# Patient Record
Sex: Male | Born: 2000 | Race: Black or African American | Hispanic: No | Smoking: Never smoker
Health system: Southern US, Community
[De-identification: ages and names within clinical notes are randomized; demographics above are authoritative.]

## PROBLEM LIST (undated history)

## (undated) DIAGNOSIS — F32A Depression, unspecified: Secondary | ICD-10-CM

## (undated) DIAGNOSIS — F419 Anxiety disorder, unspecified: Secondary | ICD-10-CM

---
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
## 2023-04-02 ENCOUNTER — Ambulatory Visit (HOSPITAL_COMMUNITY)
Admission: EM | Admit: 2023-04-02 | Discharge: 2023-04-03 | Disposition: A | Discharge status: Other Acute Inpatient Hospital | Payer: Medicaid Other | Attending: Psychiatry | Admitting: Psychiatry

## 2023-04-02 ENCOUNTER — Ambulatory Visit: Payer: Self-pay

## 2023-04-02 DIAGNOSIS — F339 Major depressive disorder, recurrent, unspecified: Secondary | ICD-10-CM | POA: Diagnosis not present

## 2023-04-02 DIAGNOSIS — R451 Restlessness and agitation: Secondary | ICD-10-CM | POA: Insufficient documentation

## 2023-04-02 DIAGNOSIS — F1210 Cannabis abuse, uncomplicated: Secondary | ICD-10-CM | POA: Insufficient documentation

## 2023-04-02 DIAGNOSIS — R45851 Suicidal ideations: Secondary | ICD-10-CM | POA: Insufficient documentation

## 2023-04-02 LAB — CBC WITH DIFFERENTIAL/PLATELET
Abs Immature Granulocytes: 0.01 10*3/uL (ref 0.00–0.07)
Basophils Absolute: 0 10*3/uL (ref 0.0–0.1)
Basophils Relative: 1 %
Eosinophils Absolute: 0.1 10*3/uL (ref 0.0–0.5)
Eosinophils Relative: 2 %
HCT: 45.6 % (ref 39.0–52.0)
Hemoglobin: 14.8 g/dL (ref 13.0–17.0)
Immature Granulocytes: 0 %
Lymphocytes Relative: 44 %
Lymphs Abs: 2.7 10*3/uL (ref 0.7–4.0)
MCH: 28.4 pg (ref 26.0–34.0)
MCHC: 32.5 g/dL (ref 30.0–36.0)
MCV: 87.5 fL (ref 80.0–100.0)
Monocytes Absolute: 0.5 10*3/uL (ref 0.1–1.0)
Monocytes Relative: 8 %
Neutro Abs: 2.8 10*3/uL (ref 1.7–7.7)
Neutrophils Relative %: 45 %
Platelets: 212 10*3/uL (ref 150–400)
RBC: 5.21 MIL/uL (ref 4.22–5.81)
RDW: 12.1 % (ref 11.5–15.5)
WBC: 6.1 10*3/uL (ref 4.0–10.5)
nRBC: 0 % (ref 0.0–0.2)

## 2023-04-02 LAB — COMPREHENSIVE METABOLIC PANEL
ALT: 16 U/L (ref 0–44)
AST: 23 U/L (ref 15–41)
Albumin: 5 g/dL (ref 3.5–5.0)
Alkaline Phosphatase: 53 U/L (ref 38–126)
Anion gap: 11 (ref 5–15)
BUN: 12 mg/dL (ref 6–20)
CO2: 24 mmol/L (ref 22–32)
Calcium: 10.2 mg/dL (ref 8.9–10.3)
Chloride: 106 mmol/L (ref 98–111)
Creatinine, Ser: 0.97 mg/dL (ref 0.61–1.24)
GFR, Estimated: >60 mL/min (ref >60)
Glucose, Bld: 85 mg/dL (ref 70–99)
Potassium: 3.8 mmol/L (ref 3.5–5.1)
Sodium: 141 mmol/L (ref 135–145)
Total Bilirubin: 0.6 mg/dL (ref <1.2)
Total Protein: 8.2 g/dL — ABNORMAL HIGH (ref 6.5–8.1)

## 2023-04-02 LAB — POCT URINE DRUG SCREEN - MANUAL ENTRY (I-SCREEN)
POC Amphetamine UR: NOT DETECTED
POC Buprenorphine (BUP): NOT DETECTED
POC Cocaine UR: NOT DETECTED
POC Marijuana UR: POSITIVE — AB
POC Methadone UR: NOT DETECTED
POC Methamphetamine UR: NOT DETECTED
POC Morphine: NOT DETECTED
POC Oxazepam (BZO): NOT DETECTED
POC Oxycodone UR: NOT DETECTED
POC Secobarbital (BAR): NOT DETECTED

## 2023-04-02 LAB — TSH: TSH: 1.886 u[IU]/mL (ref 0.350–4.500)

## 2023-04-02 LAB — ETHANOL: Alcohol, Ethyl (B): <10 mg/dL (ref <10)

## 2023-04-02 MED ORDER — TRAZODONE HCL 50 MG PO TABS: 50.0000 mg | ORAL_TABLET | Freq: Every evening | ORAL | Status: DC | PRN

## 2023-04-02 MED ORDER — LORAZEPAM 1 MG PO TABS: 1.0000 mg | ORAL_TABLET | ORAL | Status: DC | PRN

## 2023-04-02 MED ORDER — ZIPRASIDONE MESYLATE 20 MG IM SOLR: 20.0000 mg | INTRAMUSCULAR | Status: DC | PRN

## 2023-04-02 MED ORDER — MAGNESIUM HYDROXIDE 400 MG/5ML PO SUSP: 30.0000 mL | Freq: Every day | ORAL | Status: DC | PRN

## 2023-04-02 MED ORDER — ACETAMINOPHEN 325 MG PO TABS: 650.0000 mg | ORAL_TABLET | Freq: Four times a day (QID) | ORAL | Status: DC | PRN

## 2023-04-02 MED ORDER — OLANZAPINE 5 MG PO TBDP: 5.0000 mg | ORAL_TABLET | Freq: Three times a day (TID) | ORAL | Status: DC | PRN

## 2023-04-02 MED ORDER — HYDROXYZINE HCL 25 MG PO TABS
50.0000 mg | ORAL_TABLET | ORAL | Status: AC
  Administered 2023-04-02: 50 mg via ORAL
  Filled 2023-04-02: qty 2

## 2023-04-02 MED ORDER — ALUM & MAG HYDROXIDE-SIMETH 200-200-20 MG/5ML PO SUSP: 30.0000 mL | ORAL | Status: DC | PRN

## 2023-04-02 NOTE — ED Provider Notes (Signed)
[facility] Continuous Assessment Admission H&P  Date: 04/03/23 Patient Name: [facility] MRN: [id number] Chief Complaint: emotion distress,  suicidal thoughts  Diagnoses:  Final diagnoses:  Suicidal ideation  Marijuana abuse  Recurrent major depressive disorder, remission status unspecified (HCC)    HPI: [facility], 22 y/o male with a history of MDD and agitation presented to [facility] voluntarily accompanied by his fiance.  According to the patient he had a meltdown today and he was suicidal when asked what was the plan he stated he was going to take medications that he had which was Wellbutrin and clonidine he was stressed, OD on those medicines.  According to the patient he was prescribed Wellbutrin and clonidine 2 years ago when he was going to [facility] and he had never took all the medicines so he had some he was going to take them when he felt suicidal today. According to patient he is currently not seeing a psychiatrist or therapist.  According to the patient he lives with his fiance currently in school for nursing and he also work at an [employer].  Face-to-face evaluation of patient, patient is alert and oriented x 4, speech is clear, maintaining eye contact.  Patient is pleasant answer all questions appropriately.  According to patient he is not currently at the moment suicidal but he knows that he might be triggered again.  Patient denies HI, AVH or paranoia.  Patient reports he smoked marijuana on a daily basis, patient denies alcohol use or any other illicit drug use at this time.  Writer did discuss with patient that given the fact that he had a plan in place when he was suicidal today it would be best for him and in his interest to stay for overnight observation and be reassessed in the AM. Writer also spoke with Dr. Clovis Riley presented the case to him and he also is in agreement for patient to be observed overnight and reassess in the a.m. At this time patient does not seem  to be influenced by internal or external stimuli.  However given patient reason for coming in it is advised that patient be admitted.  Recommend inpatient observation.  Total Time spent with patient: 20 minutes  Musculoskeletal  Strength & Muscle Tone: within normal limits Gait & Station: normal Patient leans: N/A  Psychiatric Specialty Exam  Presentation General Appearance:  Appropriate for Environment  Eye Contact: Good  Speech: Clear and Coherent  Speech Volume: Normal  Handedness: Right   Mood and Affect  Mood: Anxious  Affect: Congruent   Thought Process  Thought Processes: Coherent  Descriptions of Associations:Circumstantial  Orientation:Full (Time, Place and Person)  Thought Content:Logical  Diagnosis of Schizophrenia or Schizoaffective disorder in past: No   Hallucinations:Hallucinations: None  Ideas of Reference:None  Suicidal Thoughts:Suicidal Thoughts: Yes, Passive SI Passive Intent and/or Plan: With Intent; With Plan  Homicidal Thoughts:Homicidal Thoughts: No   Sensorium  Memory: Immediate Fair  Judgment: Fair  Insight: Fair   Executive Functions  Concentration: Good  Attention Span: Good  Recall: Good  Fund of Knowledge: Good  Language: Good   Psychomotor Activity  Psychomotor Activity: Psychomotor Activity: Normal   Assets  Assets: Desire for Improvement; Resilience   Sleep  Sleep: Sleep: Fair Number of Hours of Sleep: 8   Nutritional Assessment (For OBS and FBC admissions only) Has the patient had a weight loss or gain of 10 pounds or more in the last 3 months?: No Has the patient had a decrease in food intake/or  appetite?: No Does the patient have dental problems?: No Does the patient have eating habits or behaviors that may be indicators of an eating disorder including binging or inducing vomiting?: No Has the patient recently lost weight without trying?: 0 Has the patient been eating poorly  because of a decreased appetite?: 0 Malnutrition Screening Tool Score: 0    Physical Exam HENT:     Head: Normocephalic.     Nose: Nose normal.  Eyes:     Pupils: Pupils are equal, round, and reactive to light.  Cardiovascular:     Rate and Rhythm: Normal rate.  Pulmonary:     Effort: Pulmonary effort is normal.  Musculoskeletal:        General: Normal range of motion.     Cervical back: Normal range of motion.  Neurological:     General: No focal deficit present.     Mental Status: He is alert.  Psychiatric:        Mood and Affect: Mood normal.        Behavior: Behavior normal.        Thought Content: Thought content normal.        Judgment: Judgment normal.    Review of Systems  Constitutional: Negative.   HENT: Negative.    Eyes: Negative.   Respiratory: Negative.    Cardiovascular: Negative.   Gastrointestinal: Negative.   Genitourinary: Negative.   Musculoskeletal: Negative.   Skin: Negative.   Neurological: Negative.   Psychiatric/Behavioral:  Positive for depression, substance abuse and suicidal ideas. The patient is nervous/anxious.     Blood pressure (!) 137/98, pulse (!) 53, temperature 98.3 F (36.8 C), temperature source Oral, resp. rate 18, SpO2 100%. There is no height or weight on file to calculate BMI.  Past Psychiatric History: MDD and agitation    Is the patient at risk to self? Yes  Has the patient been a risk to self in the past 6 months? Yes .    Has the patient been a risk to self within the distant past? No   Is the patient a risk to others? No   Has the patient been a risk to others in the past 6 months? No   Has the patient been a risk to others within the distant past? No   Past Medical History: see chart  Family History: bipolar (dad),   Social History: marijuana   Last Labs:  Admission on 04/02/2023  Component Date Value Ref Range Status   WBC 04/02/2023 6.1  4.0 - 10.5 K/uL Final   RBC 04/02/2023 5.21  4.22 - 5.81 MIL/uL  Final   Hemoglobin 04/02/2023 14.8  13.0 - 17.0 g/dL Final   HCT 16/01/9603 45.6  39.0 - 52.0 % Final   MCV 04/02/2023 87.5  80.0 - 100.0 fL Final   MCH 04/02/2023 28.4  26.0 - 34.0 pg Final   MCHC 04/02/2023 32.5  30.0 - 36.0 g/dL Final   RDW 54/12/8117 12.1  11.5 - 15.5 % Final   Platelets 04/02/2023 212  150 - 400 K/uL Final   nRBC 04/02/2023 0.0  0.0 - 0.2 % Final   Neutrophils Relative % 04/02/2023 45  % Final   Neutro Abs 04/02/2023 2.8  1.7 - 7.7 K/uL Final   Lymphocytes Relative 04/02/2023 44  % Final   Lymphs Abs 04/02/2023 2.7  0.7 - 4.0 K/uL Final   Monocytes Relative 04/02/2023 8  % Final   Monocytes Absolute 04/02/2023 0.5  0.1 - 1.0 K/uL Final  Eosinophils Relative 04/02/2023 2  % Final   Eosinophils Absolute 04/02/2023 0.1  0.0 - 0.5 K/uL Final   Basophils Relative 04/02/2023 1  % Final   Basophils Absolute 04/02/2023 0.0  0.0 - 0.1 K/uL Final   Immature Granulocytes 04/02/2023 0  % Final   Abs Immature Granulocytes 04/02/2023 0.01  0.00 - 0.07 K/uL Final   Performed at [facility], [address], [place], [place] [address]   Sodium 04/02/2023 141  135 - 145 mmol/L Final   Potassium 04/02/2023 3.8  3.5 - 5.1 mmol/L Final   Chloride 04/02/2023 106  98 - 111 mmol/L Final   CO2 04/02/2023 24  22 - 32 mmol/L Final   Glucose, Bld 04/02/2023 85  70 - 99 mg/dL Final   Glucose reference range applies only to samples taken after fasting for at least 8 hours.   BUN 04/02/2023 12  6 - 20 mg/dL Final   Creatinine, Ser 04/02/2023 0.97  0.61 - 1.24 mg/dL Final   Calcium 96/29/5284 10.2  8.9 - 10.3 mg/dL Final   Total Protein 13/24/4010 8.2 (H)  6.5 - 8.1 g/dL Final   Albumin 27/25/3664 5.0  3.5 - 5.0 g/dL Final   AST 40/34/7425 23  15 - 41 U/L Final   ALT 04/02/2023 16  0 - 44 U/L Final   Alkaline Phosphatase 04/02/2023 53  38 - 126 U/L Final   Total Bilirubin 04/02/2023 0.6  <1.2 mg/dL Final   GFR, Estimated 04/02/2023 >60  >60 mL/min Final   Comment:  (NOTE) Calculated using the CKD-EPI Creatinine Equation (2021)    Anion gap 04/02/2023 11  5 - 15 Final   Performed at [facility], [address] [place], [place] [address]   Alcohol, Ethyl (B) 04/02/2023 <10  <10 mg/dL Final   Comment: (NOTE) Lowest detectable limit for serum alcohol is 10 mg/dL.  For medical purposes only. Performed at [facility], [address] [place], [place] [address]    TSH 04/02/2023 1.886  0.350 - 4.500 uIU/mL Final   Comment: Performed by a 3rd Generation assay with a functional sensitivity of <=0.01 uIU/mL. Performed at [facility], [address], [place], [place] [address]    POC Amphetamine UR 04/02/2023 None Detected  NONE DETECTED (Cut Off Level 1000 ng/mL) Final   POC Secobarbital (BAR) 04/02/2023 None Detected  NONE DETECTED (Cut Off Level 300 ng/mL) Final   POC Buprenorphine (BUP) 04/02/2023 None Detected  NONE DETECTED (Cut Off Level 10 ng/mL) Final   POC Oxazepam (BZO) 04/02/2023 None Detected  NONE DETECTED (Cut Off Level 300 ng/mL) Final   POC Cocaine UR 04/02/2023 None Detected  NONE DETECTED (Cut Off Level 300 ng/mL) Final   POC Methamphetamine UR 04/02/2023 None Detected  NONE DETECTED (Cut Off Level 1000 ng/mL) Final   POC Morphine 04/02/2023 None Detected  NONE DETECTED (Cut Off Level 300 ng/mL) Final   POC Methadone UR 04/02/2023 None Detected  NONE DETECTED (Cut Off Level 300 ng/mL) Final   POC Oxycodone UR 04/02/2023 None Detected  NONE DETECTED (Cut Off Level 100 ng/mL) Final   POC Marijuana UR 04/02/2023 Positive (A)  NONE DETECTED (Cut Off Level 50 ng/mL) Final    Allergies: Shellfish allergy  Medications:  Facility Ordered Medications  Medication   acetaminophen (TYLENOL) tablet 650 mg   alum & mag hydroxide-simeth (MAALOX/MYLANTA) 200-200-20 MG/5ML suspension 30 mL   magnesium hydroxide (MILK OF MAGNESIA) suspension 30 mL   OLANZapine  zydis (ZYPREXA) disintegrating tablet 5 mg   And   LORazepam  (ATIVAN) tablet 1 mg   And   ziprasidone (GEODON) injection 20 mg   [COMPLETED] hydrOXYzine (ATARAX) tablet 50 mg   traZODone (DESYREL) tablet 50 mg      Medical Decision Making  Inpatient observation  Lab Orders         SARS Coronavirus 2 by RT PCR (hospital order, performed in [facility] hospital lab) *cepheid single result test* Anterior Nasal Swab         CBC with Differential/Platelet         Comprehensive metabolic panel         Ethanol         TSH         POCT Urine Drug Screen - (I-Screen)       Meds ordered this encounter  Medications   acetaminophen (TYLENOL) tablet 650 mg   alum & mag hydroxide-simeth (MAALOX/MYLANTA) 200-200-20 MG/5ML suspension 30 mL   magnesium hydroxide (MILK OF MAGNESIA) suspension 30 mL   AND Linked Order Group    OLANZapine zydis (ZYPREXA) disintegrating tablet 5 mg    LORazepam (ATIVAN) tablet 1 mg    ziprasidone (GEODON) injection 20 mg   hydrOXYzine (ATARAX) tablet 50 mg   traZODone (DESYREL) tablet 50 mg    Recommendations  Based on my evaluation the patient does not appear to have an emergency medical condition.  [name], NP 04/03/23  5:31 AM

## 2023-04-02 NOTE — Telephone Encounter (Signed)
Chief Complaint: Suicidal ideation Symptoms: Plan and access to pills for plan Frequency: Ongoing Pertinent Negatives: Patient denies Current thoughts of suicide Disposition: [] ED /[x] Urgent Care (no appt availability in office) / [] Appointment(In office/virtual)/ []  [facility]/ [] Home Care/ [] Refused Recommended Disposition /[] [facility]/ []  Follow-up with PCP Additional Notes: Pt called with suicidal ideation. He plans to take a bunch of pills and has those pills available. Pt has struggled with mental health since the 7th grade. He used to have medication to help, but stopped taking the medication. Pt restarted meds in November 23? but meds are old. Pt is in college with a full load, and had a disagreement today with fiancee. Argument has been resolved.  Pt given phone number for BH hot line, and [facility]. Pt states he will go to [facility] now for care. Pt will call back if needed.    Reason for Disposition  [1] Patient is not threatening suicide now BUT [2] has a suicide PLAN (e.g., overdose, gunshot) and ACCESS (e.g., collecting pills, gun in house)  Answer Assessment - Initial Assessment Questions 1. MAIN CONCERN: "What happened that made you call today?"     Suicidal thoughts 2. RISK OF HARM - SUICIDAL IDEATION:  "Do you ever have thoughts of hurting or killing yourself?"  (e.g., yes, no, no but preoccupation with thoughts about death)   - WISH TO BE DEAD:  "Have you wished you were dead or wished you could go to sleep and not wake up?"   - INTENT:  "Have you had any thoughts of hurting or killing yourself?" (e.g., yes, no, N/A) If Yes, ask: "Are you having these thoughts about killing yourself right NOW?"   - PLAN: "Have you thought about how you might do this?" "Do you have a specific plan for how you would do this?" (e.g., gun, knife, overdose, no plan, N/A)   - ACCESS: If yes to PLAN, "Do you have access to pills?" (e.g., pills, gun in house, knife in kitchen)      Wishing he was dead - a bunch of pills. 3. RISK OF HARM - SUICIDE ATTEMPT: "Have you tried to harm yourself recently?" If Yes, ask: When was this?"  "What type of harm was tried?"     No yet 4. RISK OF HARM - SUICIDAL BEHAVIOR: "Have you ever done anything, started to do anything, or prepared to do anything to end your life?" (e.g., collected pills, bought a gun, wrote a suicide note, cut yourself, started but changed your mind)     yes 5. EVENTS AND STRESSORS: "Has there been any new stress or recent changes in your life?" (e.g., death of loved one, homelessness, negative event, relationship breakup, work)     Worse - argument with fiance 6. FUNCTIONAL IMPAIRMENT: "How have things been going for you overall? Have you had more difficulty than usual doing your normal daily activities?"  (e.g., better, same, worse; self-care, school, work, interactions)     stress 7. SUPPORT: "Who is with you now?" "Who do you live with?" "Do you have family or friends who you can talk to?"      fiance 8. THERAPIST: "Do you have a counselor or therapist? What is their name?"     no 9. ALCOHOL USE OR SUBSTANCE USE (DRUG USE): "Do you drink alcohol or use any illegal drugs (or prescription drugs in ways other than prescribed)?"     weed 10. OTHER: "Do you have any other physical symptoms right now?" (e.g.,  fever)       No - sweaty palms  Protocols used: Suicide Concerns-A-AH

## 2023-04-02 NOTE — Progress Notes (Signed)
   04/02/23 1750  [facility] Triage Screening (Walk-ins at [facility] only)  How Did You Hear About [place]? Self  What Is the Reason for Your Visit/Call Today? Patient is a 22 y.o. male with a hx of depression, untreated, who presents voluntarily accompanied by fiance for assessment.  Patient reports he has been experiencing depression and "other mental health issues" since 7th grade.  Patient is a Holiday representative at [employer], Chiropractor.  He reports having an agrument with fiance this morning, stating he needed time to himself.  During this moment, patient began to contemplate overdosing on wellbutrin and guanfacine (old Rx meds from 2 yrs ago).  He was getting ready to take the pills when his girlfriend happened to walk in.  Patient states he had a bit of a melt down and fiance was able to help him calm.  She asked if he thought he needed to be evaluated and he agreed to come.  Patient denies current suicidal intent, however believes he will continue to "have the thoughts."  Patient states he would prefer to follow up with outpatient treatment, stating he has a rigorous school schedule.  He is also uncomfortable with staying away from home.  Patient states he began taking the Wellbutrin again "therapeutically" on 11/23, hoping for some relief with depressive symptoms.  Patient denies HI and AVH.  He does report using THC daily, stating "it does help some."  How Long Has This Been Causing You Problems? > than 6 months  Have You Recently Had Any Thoughts About Hurting Yourself? Yes  How long ago did you have thoughts about hurting yourself? Today  Are You Planning to Commit Suicide/Harm Yourself At This time? No  Have you Recently Had Thoughts About Hurting Someone Karolee Ohs? No  Are You Planning To Harm Someone At This Time? No  Physical Abuse Denies  Verbal Abuse Denies  Sexual Abuse Denies  Exploitation of patient/patient's resources Denies  Self-Neglect Denies  Possible abuse reported to:  (N/A)  Are you currently  experiencing any auditory, visual or other hallucinations? No  Have You Used Any Alcohol or Drugs in the Past 24 Hours? Yes  How long ago did you use Drugs or Alcohol? Today  What Did You Use and How Much? THC - amt unknown  Do you have any current medical co-morbidities that require immediate attention? No  Clinician description of patient physical appearance/behavior: Patient is calm, cooperative, pleasant AAOx5  What Do You Feel Would Help You the Most Today? Treatment for Depression or other mood problem  If access to [facility] was not available, would you have sought care in the Emergency Department? No  Determination of Need Urgent (48 hours)  Options For Referral Medication Management;Outpatient Therapy;Intensive Outpatient Therapy;Inpatient Hospitalization;BH Urgent Care  Determination of Need filed? Yes
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
## 2023-04-03 ENCOUNTER — Encounter (HOSPITAL_COMMUNITY): Payer: Self-pay | Admitting: Student in an Organized Health Care Education/Training Program

## 2023-04-03 ENCOUNTER — Other Ambulatory Visit: Payer: Self-pay

## 2023-04-03 ENCOUNTER — Inpatient Hospital Stay (HOSPITAL_COMMUNITY)
Admission: AD | Admit: 2023-04-03 | Discharge: 2023-04-07 | DRG: 885 | Disposition: A | Discharge status: Home / Self Care | Payer: Medicaid Other | Source: Intra-hospital | Attending: Psychiatry | Admitting: Psychiatry

## 2023-04-03 ENCOUNTER — Inpatient Hospital Stay (HOSPITAL_COMMUNITY): Admit: 2023-04-03 | Payer: Medicaid Other | Admitting: Psychiatry

## 2023-04-03 ENCOUNTER — Encounter (HOSPITAL_COMMUNITY): Payer: Self-pay

## 2023-04-03 DIAGNOSIS — Z733 Stress, not elsewhere classified: Secondary | ICD-10-CM | POA: Diagnosis not present

## 2023-04-03 DIAGNOSIS — Z5941 Food insecurity: Secondary | ICD-10-CM

## 2023-04-03 DIAGNOSIS — Z789 Other specified health status: Secondary | ICD-10-CM | POA: Diagnosis not present

## 2023-04-03 DIAGNOSIS — Z833 Family history of diabetes mellitus: Secondary | ICD-10-CM

## 2023-04-03 DIAGNOSIS — Z5986 Financial insecurity: Secondary | ICD-10-CM | POA: Diagnosis not present

## 2023-04-03 DIAGNOSIS — F1210 Cannabis abuse, uncomplicated: Secondary | ICD-10-CM | POA: Diagnosis present

## 2023-04-03 DIAGNOSIS — Z818 Family history of other mental and behavioral disorders: Secondary | ICD-10-CM | POA: Diagnosis not present

## 2023-04-03 DIAGNOSIS — R45851 Suicidal ideations: Secondary | ICD-10-CM | POA: Diagnosis present

## 2023-04-03 DIAGNOSIS — F411 Generalized anxiety disorder: Secondary | ICD-10-CM | POA: Insufficient documentation

## 2023-04-03 DIAGNOSIS — Z87892 Personal history of anaphylaxis: Secondary | ICD-10-CM

## 2023-04-03 DIAGNOSIS — Z91013 Allergy to seafood: Secondary | ICD-10-CM | POA: Diagnosis not present

## 2023-04-03 DIAGNOSIS — F332 Major depressive disorder, recurrent severe without psychotic features: Secondary | ICD-10-CM | POA: Diagnosis present

## 2023-04-03 DIAGNOSIS — Z8249 Family history of ischemic heart disease and other diseases of the circulatory system: Secondary | ICD-10-CM | POA: Diagnosis not present

## 2023-04-03 DIAGNOSIS — R197 Diarrhea, unspecified: Secondary | ICD-10-CM | POA: Diagnosis not present

## 2023-04-03 DIAGNOSIS — Z630 Problems in relationship with spouse or partner: Secondary | ICD-10-CM

## 2023-04-03 DIAGNOSIS — Z91199 Patient's noncompliance with other medical treatment and regimen due to unspecified reason: Secondary | ICD-10-CM | POA: Diagnosis not present

## 2023-04-03 MED ORDER — LORAZEPAM 1 MG PO TABS: 1.0000 mg | ORAL_TABLET | Freq: Four times a day (QID) | ORAL | Status: DC | PRN

## 2023-04-03 MED ORDER — ENSURE ENLIVE PO LIQD
237.0000 mL | Freq: Two times a day (BID) | ORAL | Status: DC
  Administered 2023-04-03 – 2023-04-04 (×3): 237 mL via ORAL
  Filled 2023-04-03 (×10): qty 237

## 2023-04-03 MED ORDER — DIPHENHYDRAMINE HCL 50 MG/ML IJ SOLN: 50.0000 mg | Freq: Four times a day (QID) | INTRAMUSCULAR | Status: DC | PRN

## 2023-04-03 MED ORDER — LORAZEPAM 2 MG/ML IJ SOLN
1.0000 mg | Freq: Four times a day (QID) | INTRAMUSCULAR | Status: DC | PRN
Start: 2023-04-03 — End: 2023-04-07

## 2023-04-03 MED ORDER — MAGNESIUM HYDROXIDE 400 MG/5ML PO SUSP: 30.0000 mL | Freq: Every day | ORAL | Status: DC | PRN

## 2023-04-03 MED ORDER — HALOPERIDOL LACTATE 5 MG/ML IJ SOLN: 5.0000 mg | Freq: Four times a day (QID) | INTRAMUSCULAR | Status: DC | PRN

## 2023-04-03 MED ORDER — ALUM & MAG HYDROXIDE-SIMETH 200-200-20 MG/5ML PO SUSP
30.0000 mL | ORAL | Status: DC | PRN
  Administered 2023-04-05: 30 mL via ORAL
  Filled 2023-04-03 (×2): qty 30

## 2023-04-03 MED ORDER — HALOPERIDOL 5 MG PO TABS: 5.0000 mg | ORAL_TABLET | Freq: Four times a day (QID) | ORAL | Status: DC | PRN

## 2023-04-03 MED ORDER — HYDROXYZINE HCL 25 MG PO TABS
25.0000 mg | ORAL_TABLET | Freq: Three times a day (TID) | ORAL | Status: DC | PRN
  Administered 2023-04-03 – 2023-04-06 (×3): 25 mg via ORAL
  Filled 2023-04-03 (×3): qty 1

## 2023-04-03 MED ORDER — TRAZODONE HCL 50 MG PO TABS
50.0000 mg | ORAL_TABLET | Freq: Every evening | ORAL | Status: DC | PRN
  Administered 2023-04-03 – 2023-04-06 (×4): 50 mg via ORAL
  Filled 2023-04-03 (×5): qty 1

## 2023-04-03 MED ORDER — ACETAMINOPHEN 325 MG PO TABS: 650.0000 mg | ORAL_TABLET | Freq: Four times a day (QID) | ORAL | Status: DC | PRN

## 2023-04-03 MED ORDER — DIPHENHYDRAMINE HCL 25 MG PO CAPS: 50.0000 mg | ORAL_CAPSULE | Freq: Four times a day (QID) | ORAL | Status: DC | PRN

## 2023-04-03 MED ORDER — HYDROXYZINE HCL 25 MG PO TABS
25.0000 mg | ORAL_TABLET | Freq: Three times a day (TID) | ORAL | Status: DC | PRN
  Administered 2023-04-03: 25 mg via ORAL
  Filled 2023-04-03: qty 1

## 2023-04-03 NOTE — ED Notes (Signed)
Pt sleeping in no acute distress. RR even and unlabored. Environment secured. Will continue to monitor for safety. 

## 2023-04-03 NOTE — Plan of Care (Signed)
  Problem: Education: Goal: Emotional status will improve Outcome: Progressing Goal: Mental status will improve Outcome: Progressing   Problem: Activity: Goal: Interest or engagement in activities will improve Outcome: Progressing   Problem: Coping: Goal: Ability to demonstrate self-control will improve Outcome: Progressing   Problem: Safety: Goal: Periods of time without injury will increase Outcome: Progressing

## 2023-04-03 NOTE — ED Notes (Signed)
Pt laying in bed at this hour. No apparent distress. RR even and unlabored. Monitored for safety.

## 2023-04-03 NOTE — ED Provider Notes (Addendum)
FBC/OBS ASAP Discharge Summary  Date and Time: 04/03/2023 11:25 AM  Name: Blake Ellis  MRN:  [id number]   Discharge Diagnoses:  Final diagnoses:  Suicidal ideation  Marijuana abuse  Recurrent major depressive disorder, remission status unspecified [facility])    Stay Summary: HPI on arrival to [facility]:  "Blake Ellis, 22 y/o male with a history of MDD and agitation presented to [facility] voluntarily accompanied by his fiance.  According to the patient he had a meltdown today and he was suicidal when asked what was the plan he stated he was going to take medications that he had which was Wellbutrin and clonidine he was stressed, OD on those medicines.  According to the patient he was prescribed Wellbutrin and clonidine 2 years ago when he was going to [facility] medical and he had never took all the medicines so he had some he was going to take them when he felt suicidal today. According to patient he is currently not seeing a psychiatrist or therapist.  According to the patient he lives with his fiance currently in school for nursing and he also work at an [employer].   Face-to-face evaluation of patient, patient is alert and oriented x 4, speech is clear, maintaining eye contact.  Patient is pleasant answer all questions appropriately.  According to patient he is not currently at the moment suicidal but he knows that he might be triggered again.  Patient denies HI, AVH or paranoia.  Patient reports he smoked marijuana on a daily basis, patient denies alcohol use or any other illicit drug use at this time.  Writer did discuss with patient that given the fact that he had a plan in place when he was suicidal today it would be best for him and in his interest to stay for overnight observation and be reassessed in the AM. Writer also spoke with Dr. Clovis Riley presented the case to him and he also is in agreement for patient to be observed overnight and reassess in the a.m. At this time patient does not seem to be  influenced by internal or external stimuli.  However given patient reason for coming in it is advised that patient be admitted."  On day of discharge, the patient reports continues symptoms of depression and anxiety. He denies any SI, HI, or AVH.   Labs were reviewed with the patient, and abnormal results were discussed with the patient.  On arrival to [facility], the patient was initially diagnosed with recurrent major depressive disorder. He was evaluated on day of discharge and where he again admitted to recent active SI with near attempted OD on old prescribed medications. Upon evaluation on day of discharge he has possible r/o for bipolar II disorder, as he reported hx of periods of expansive energy and mood he cycles in and out of. At most he is reporting a maximum of 48 consecutive hours historically, characterized by symptoms of insomnia, increased goal directed activity, pressured speech, flight of ideas.  On assessment, there is no evidence of manic symptoms, patient's most recent episode appears to be depressive.   Patient is also reporting excessive anxiety and worry occurring most days, with difficulty controlling the worries, restlessness, and thought blocking. He likely meets criteria for GAD.   He also meets criteria  for cannabis use disorder,  as he is reporting relying on daily use with cravings and withdrawal symptoms when he tries to stop.   Ultimately the patient agrees to voluntary admission for psychiatric stabilization.   Patient's psychiatric medications were  adjusted on admission to observation: None  Patient's care was discussed during the interdisciplinary team meeting every day during the hospitalization.  Gradually, patient started adjusting to milieu. The patient was evaluated each day by a clinical provider to ascertain response to treatment. Improvement was noted by the patient's report of decreasing symptoms, improved sleep and appetite, affect, medication tolerance,  behavior, and participation in unit programming.  Patient was asked each day to complete a self inventory noting mood, mental status, pain, new symptoms, anxiety and concerns.    Symptoms were reported as significantly decreased or resolved completely by discharge.    Total Time spent with patient: 1 hour  Past Psychiatric History:  No current psychiatrist or therapist. Reports previously going to [facility]. Denies any prior psychiatric diagnoses Trialed previously on Wellbutrin and clonidine 2 years ago, but discontinued medications shortly after being prescribed this.  No prior psychiatric hospitalizations No prior Suicide attempts  Past Medical History: No significant PMHx. No medical conditions or medications/supplements. No hx of surgeries. Allergy only to shellfish.   Social History:  Lives with fiance of 1.5 years. He is currently a Information systems manager.  Tobacco Cessation:  N/A, patient does not currently use tobacco products  Current Medications:  Current Facility-Administered Medications  Medication Dose Route Frequency Provider Last Rate Last Admin   acetaminophen (TYLENOL) tablet 650 mg  650 mg Oral Q6H PRN [name], NP       alum & mag hydroxide-simeth (MAALOX/MYLANTA) 200-200-20 MG/5ML suspension 30 mL  30 mL Oral Q4H PRN [name], NP       hydrOXYzine (ATARAX) tablet 25 mg  25 mg Oral TID PRN Lorri Frederick, MD   25 mg at 04/03/23 1003   OLANZapine zydis (ZYPREXA) disintegrating tablet 5 mg  5 mg Oral Q8H PRN [name], NP       And   LORazepam (ATIVAN) tablet 1 mg  1 mg Oral PRN [name], NP       And   ziprasidone (GEODON) injection 20 mg  20 mg Intramuscular PRN [name], NP       magnesium hydroxide (MILK OF MAGNESIA) suspension 30 mL  30 mL Oral Daily PRN [name], NP       traZODone (DESYREL) tablet 50 mg  50 mg Oral QHS PRN [name], NP       No current outpatient medications on file.    PTA  Medications:  Facility Ordered Medications  Medication   acetaminophen (TYLENOL) tablet 650 mg   alum & mag hydroxide-simeth (MAALOX/MYLANTA) 200-200-20 MG/5ML suspension 30 mL   magnesium hydroxide (MILK OF MAGNESIA) suspension 30 mL   OLANZapine zydis (ZYPREXA) disintegrating tablet 5 mg   And   LORazepam (ATIVAN) tablet 1 mg   And   ziprasidone (GEODON) injection 20 mg   [COMPLETED] hydrOXYzine (ATARAX) tablet 50 mg   traZODone (DESYREL) tablet 50 mg   hydrOXYzine (ATARAX) tablet 25 mg        No data to display          Flowsheet Row ED from 04/02/2023 in [facility]  C-SSRS RISK CATEGORY High Risk       Musculoskeletal  Strength & Muscle Tone: within normal limits Gait & Station: normal Patient leans: N/A  Psychiatric Specialty Exam  Presentation  General Appearance:  Appropriate for Environment  Eye Contact: Good  Speech: Clear and Coherent; Normal Rate  Speech Volume: Normal  Handedness: -- (not assessed)   Mood and  Affect  Mood: Anxious ("Really worried")  Affect: Congruent; Depressed   Thought Process  Thought Processes: Linear  Descriptions of Associations:Intact  Orientation:-- (grossly intact)  Thought Content:Logical  Diagnosis of Schizophrenia or Schizoaffective disorder in past: No    Hallucinations:Hallucinations: None  Ideas of Reference:None  Suicidal Thoughts:Suicidal Thoughts: No SI Passive Intent and/or Plan: With Intent; With Plan  Homicidal Thoughts:Homicidal Thoughts: No   Sensorium  Memory: Immediate Good; Recent Good; Remote Good  Judgment: Fair  Insight: Fair   Chartered certified accountant: Fair  Attention Span: Fair  Recall: [employer] of Knowledge: Fair  Language: Fair   Psychomotor Activity  Psychomotor Activity: Psychomotor Activity: Normal   Assets  Assets: Desire for Improvement; Resilience; Communication Skills   Sleep   Sleep: Sleep: Poor Number of Hours of Sleep: 8   Nutritional Assessment (For OBS and FBC admissions only) Has the patient had a weight loss or gain of 10 pounds or more in the last 3 months?: No Has the patient had a decrease in food intake/or appetite?: No Does the patient have dental problems?: No Does the patient have eating habits or behaviors that may be indicators of an eating disorder including binging or inducing vomiting?: No Has the patient recently lost weight without trying?: 0 Has the patient been eating poorly because of a decreased appetite?: 0 Malnutrition Screening Tool Score: 0    Physical Exam  Physical Exam Vitals and nursing note reviewed.  Constitutional:      General: He is not in acute distress.    Appearance: He is well-developed.  HENT:     Head: Normocephalic and atraumatic.  Eyes:     Conjunctiva/sclera: Conjunctivae normal.  Cardiovascular:     Rate and Rhythm: Normal rate and regular rhythm.     Heart sounds: No murmur heard. Pulmonary:     Effort: Pulmonary effort is normal. No respiratory distress.     Breath sounds: Normal breath sounds.  Abdominal:     Palpations: Abdomen is soft.     Tenderness: There is no abdominal tenderness.  Musculoskeletal:        General: No swelling.     Cervical back: Neck supple.  Skin:    General: Skin is warm and dry.     Capillary Refill: Capillary refill takes less than 2 seconds.  Neurological:     Mental Status: He is alert.  Psychiatric:        Mood and Affect: Mood normal.    Review of Systems  Respiratory:  Negative for shortness of breath.   Cardiovascular:  Negative for chest pain.  Gastrointestinal:  Negative for abdominal pain and vomiting.  Neurological:  Negative for dizziness and headaches.   Blood pressure 117/88, pulse 87, temperature 98.3 F (36.8 C), temperature source Oral, resp. rate 18, SpO2 99%. There is no height or weight on file to calculate BMI.  Demographic Factors:   Male  Loss Factors: Financial problems/change in socioeconomic status  Historical Factors: Impulsivity  Risk Reduction Factors:   Living with another person, especially a relative and Positive social support  Continued Clinical Symptoms:  Unstable or Poor Therapeutic Relationship  Cognitive Features That Contribute To Risk:  None    Suicide Risk:  Moderate:  Frequent suicidal ideation with limited intensity, and duration, some specificity in terms of plans, no associated intent, good self-control, limited dysphoria/symptomatology, some risk factors present, and identifiable protective factors, including available and accessible social support.  Plan Of Care/Follow-up recommendations:  Activity: as tolerated  Diet: heart healthy  Other: -Follow-up with your outpatient psychiatric provider -instructions on appointment date, time, and address (location) are provided to you in discharge paperwork.  -Take your psychiatric medications as prescribed at discharge - instructions are provided to you in the discharge paperwork  -Follow-up with outpatient primary care doctor and other specialists -for management of preventative medicine and chronic medical disease, including: None  -Testing: Follow-up with outpatient provider for abnormal lab results: None  -Recommend abstinence from alcohol, tobacco, and other illicit drug use at discharge.   -If your psychiatric symptoms recur, worsen, or if you have side effects to your psychiatric medications, call your outpatient psychiatric provider, 911, 988 or go to the nearest emergency department.  -If suicidal thoughts recur, call your outpatient psychiatric provider, 911, 988 or go to the nearest emergency department.   Disposition: Admission and transfer to [facility] adult unit.   Lorri Frederick, MD 04/03/2023, 11:25 AM

## 2023-04-03 NOTE — ED Notes (Signed)
Report called to [facility] . Safe transport called.

## 2023-04-03 NOTE — ED Notes (Signed)
Pt sleeping at present, no distress noted.  Monitoring for safety. 

## 2023-04-03 NOTE — Progress Notes (Signed)
Pt admitted to [facility] under voluntary status from [facility] where he presented initially with complain of SI with plan to overdose on his home medications (Wellbutrin & Intunive) after an argument with his fiancee. Pt presents with fair eye contact, logical speech, anxious, restless but pleasant on interactions. Denies SI, HI, AVH and pain when assessed. Rated his anxiety 6/10 at the time "It's my first time in a hospital like this, but I need to be here. I need to get back on my medicines". Per pt "I got into an argument with my fiancee because she got upset about something I did earlier. She kept talking about it over and over and that upset me. Everything just escalated from there and I wanted to take all the pills I had at home" when asked of events leading to admission. Pt reported anger outbursts over the years, "I saw a doctor about two years ago and she started me on the Wellbutrin and Intunive). I took the Wellbutrin at the time but not the Intunive. I did stopped the Wellbutrin but restarted it again when I noticed my mood changing again". 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 Reports decline in appetite with a loss of approximately 10 lbs. Smokes marijuana daily "I smoke 2-3 blounts daily; It helps with my anxiety and sleep. When I smoke I sleep about 7-8 hours but without it I usually get 4 hours of sleep". Pt's goal is "To get control of my mood, start and stay on my medications when I leave here". Skin assessment done, tattoos noted to right arm and left wrist. Belongings searched, items deemed contraband secured in locker.  Ambulatory to unit with steady gait. Unit orientation done, routines discussed, care plan reviewed and admission documents signed. Emotional support, encouragement and reassurance offered. Safety checks initiated at Q 15 minutes intervals without incident thus far.

## 2023-04-03 NOTE — Discharge Instructions (Signed)
On behalf of the Psychiatry team at [facility], it was a pleasure caring for you. Below are outlined additional information and resources for when you are discharged:   -Recommend abstinence from alcohol, tobacco, and other illicit drug use at discharge.  -If your psychiatric symptoms recur, worsen, or if you have side effects to your psychiatric medications, call your outpatient psychiatric provider, 911, 988 or go to the nearest emergency department. -If suicidal thoughts recur, call your outpatient psychiatric provider, 911, 988 or go to the nearest emergency department.   The [facility] Urgent [facility] will provide timely access to mental health services for children and adolescents (age 33 - 79) and adults presenting in a mental health crisis. The program is designed for those who need urgent behavioral health or substance use treatment and are not experiencing a medical crisis that would typically require an emergency room visit.   We also offer the following outpatient services: Individual Therapy Partial Hospitalization Program (PHP) Substance Abuse Intensive Outpatient Program [facility]) Specialized Intensive Adult Group Therapy Medication Management Peer Living Room  CONTACT INFORMATION Phone: [phone] Address: [address] [place], [place] [address] Hours: Open 24/7, No appointment required.   Activity: as tolerated  Diet: heart healthy  Other: -Follow-up with your outpatient psychiatric provider -instructions on appointment date, time, and address (location) are provided to you in discharge paperwork.  -Take your psychiatric medications as prescribed at discharge - instructions are provided to you in the discharge paperwork  -Follow-up with outpatient primary care doctor and other specialists -for management of preventative medicine and chronic medical disease, including: None  -Testing: Follow-up with outpatient provider for abnormal lab results:  None  -Recommend abstinence from alcohol, tobacco, and other illicit drug use at discharge.   -If your psychiatric symptoms recur, worsen, or if you have side effects to your psychiatric medications, call your outpatient psychiatric provider, 911, 988 or go to the nearest emergency department.  -If suicidal thoughts recur, call your outpatient psychiatric provider, 911, 988 or go to the nearest emergency department.

## 2023-04-03 NOTE — ED Notes (Signed)
Pt here voluntarily with his fiancee. Pt denies SI, HI, AVH and pain. Pt endorses anxiety, depression and irritability. 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 Pt states he has had suicidal ideation but this is the closest he has come to killing himself. Pt states his plan was to take pills but his fiancee stopped him. They had an argument tonight and that, combined with his stressors, was overwhelming. Pt was on medication 2 years ago (bupropion and guanfacine) but was not taking them consistently. "I didn't give them a chance to work. Recently I started retaking the old prescription of bupropion." Pt endorses daily marijuana use for anxiety and sleep. Pt says that they have had financial stressors lately, often having trouble buying food. Pt denies access to firearms, alcohol and other drug use. Pt contracts for safety on the unit. Pt is in Adult Bed 4.

## 2023-04-03 NOTE — ED Notes (Signed)
Pt asleep at this hour. No apparent distress. RR even and unlabored. Monitored for safety.  

## 2023-04-03 NOTE — Plan of Care (Signed)
  Problem: Safety: Goal: Periods of time without injury will increase Outcome: Progressing   Problem: Activity: Goal: Interest or engagement in activities will improve Outcome: Progressing   Problem: Coping: Goal: Ability to verbalize frustrations and anger appropriately will improve Outcome: Progressing

## 2023-04-03 NOTE — Progress Notes (Addendum)
Pt has been accepted to [facility] on 04/03/2023 Bed assignment: 305-1  Pt meets inpatient criteria per: Ambrose Finland MD   Attending Physician will be: Phineas Inches, MD   Report can be called to: Adult unit: [phone]  Pt can arrive after Discharges   Care Team Notified: [facility] Rona Ravens RN, Ambrose Finland MD, Sharion Dove RN, Cardwell Fiscus RN   [place] Shadai Mcclane LCSW-A   04/03/2023 10:35 AM

## 2023-04-03 NOTE — ED Notes (Signed)
Pt transferred to [facility] via safe transport in no acute distress. Pt denies SI/HI/AVH at current but unable to contract for safety if discharged to home. Pt consented to transport to [facility]. Belongings given to safe transport driver from locker #82. Safety maintained.

## 2023-04-04 ENCOUNTER — Encounter (HOSPITAL_COMMUNITY): Payer: Self-pay

## 2023-04-04 DIAGNOSIS — F332 Major depressive disorder, recurrent severe without psychotic features: Secondary | ICD-10-CM | POA: Diagnosis not present

## 2023-04-04 MED ORDER — NICOTINE POLACRILEX 2 MG MT GUM: 2.0000 mg | CHEWING_GUM | OROMUCOSAL | Status: DC | PRN

## 2023-04-04 MED ORDER — FLUOXETINE HCL 20 MG PO CAPS
20.0000 mg | ORAL_CAPSULE | Freq: Every day | ORAL | Status: DC
  Administered 2023-04-04 – 2023-04-07 (×4): 20 mg via ORAL
  Filled 2023-04-04 (×5): qty 1
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
## 2023-04-04 NOTE — [facility] Group Notes (Signed)
[facility] Group Notes:  (Nursing/MHT/Case Management/Adjunct)  Date:  04/04/2023  Time:  12:12 AM  Type of Therapy:  Psychoeducational Skills  Participation Level:  Minimal  Participation Quality:  Attentive  Affect:  Appropriate  Cognitive:  Lacking  Insight:  Appropriate  Engagement in Group:  Limited  Modes of Intervention:  Exploration  Summary of Progress/Problems: Patient rated his day as an 8 out of 10 because he felt better. He states that he was more active today. Nothing else was shared with the group regarding his day.   Blake Ellis S 04/04/2023, 12:12 AM

## 2023-04-04 NOTE — Tx Team (Signed)
Initial Treatment Plan 04/04/2023 7:46 PM Blake Ellis ZOX:[id number]    PATIENT STRESSORS: Educational concerns   Financial difficulties   Marital or family conflict   Medication change or noncompliance   Substance abuse     PATIENT STRENGTHS: Ability for insight  Capable of independent living  Physical Health  Supportive family/friends    PATIENT IDENTIFIED PROBLEMS: Risk for self harm "I wanted to overdose on my home medications after the argument with my girlfriend".    Medication noncompliance "I stopped taking my medications because it expired but I picked up again".    Educational concerns "I'm here missing school at [employer] but I need this help".    Alterations in mood "I've been having outbursts a lot lately"    Financial concerns "I'm not working right now. I'm in here".     DISCHARGE CRITERIA:  Improved stabilization in mood, thinking, and/or behavior Verbal commitment to aftercare and medication compliance Withdrawal symptoms are absent or subacute and managed without 24-hour nursing intervention  PRELIMINARY DISCHARGE PLAN: Outpatient therapy Return to previous work or school arrangements  PATIENT/FAMILY INVOLVEMENT: This treatment plan has been presented to and reviewed with the patient, Blake Ellis.  The patient have been given the opportunity to ask questions and make suggestions.  Sherryl Manges, RN 04/04/2023, 7:46 PM

## 2023-04-04 NOTE — BH IP Treatment Plan (Signed)
Interdisciplinary Treatment and Diagnostic Plan Update  04/04/2023 Time of Session: 10:45 am Blake Ellis MRN: [id number]  Principal Diagnosis: MDD (major depressive disorder), recurrent severe, without psychosis (HCC)  Secondary Diagnoses: Principal Problem:   MDD (major depressive disorder), recurrent severe, without psychosis (HCC)   Current Medications:  Current Facility-Administered Medications  Medication Dose Route Frequency Provider Last Rate Last Admin   acetaminophen (TYLENOL) tablet 650 mg  650 mg Oral Q6H PRN Carrion-Carrero, Margely, MD       alum & mag hydroxide-simeth (MAALOX/MYLANTA) 200-200-20 MG/5ML suspension 30 mL  30 mL Oral Q4H PRN Carrion-Carrero, Margely, MD       diphenhydrAMINE (BENADRYL) capsule 50 mg  50 mg Oral Q6H PRN Carrion-Carrero, Margely, MD       Or   diphenhydrAMINE (BENADRYL) injection 50 mg  50 mg Intramuscular Q6H PRN Carrion-Carrero, Margely, MD       feeding supplement (ENSURE ENLIVE / ENSURE PLUS) liquid 237 mL  237 mL Oral BID BM Nkwenti, Malie Kashani, NP   237 mL at 04/04/23 1008   haloperidol (HALDOL) tablet 5 mg  5 mg Oral Q6H PRN Carrion-Carrero, Margely, MD       Or   haloperidol lactate (HALDOL) injection 5 mg  5 mg Intramuscular Q6H PRN Carrion-Carrero, Margely, MD       hydrOXYzine (ATARAX) tablet 25 mg  25 mg Oral TID PRN Carrion-Carrero, Karle Starch, MD   25 mg at 04/03/23 1654   LORazepam (ATIVAN) tablet 1 mg  1 mg Oral Q6H PRN Carrion-Carrero, Margely, MD       Or   LORazepam (ATIVAN) injection 1 mg  1 mg Intramuscular Q6H PRN Carrion-Carrero, Margely, MD       magnesium hydroxide (MILK OF MAGNESIA) suspension 30 mL  30 mL Oral Daily PRN Carrion-Carrero, Margely, MD       traZODone (DESYREL) tablet 50 mg  50 mg Oral QHS PRN Lorri Frederick, MD   50 mg at 04/03/23 2102   PTA Medications: No medications prior to admission.    Patient Stressors:    Patient Strengths:    Treatment Modalities: Medication Management, Group therapy,  Case management,  1 to 1 session with clinician, Psychoeducation, Recreational therapy.   Physician Treatment Plan for Primary Diagnosis: MDD (major depressive disorder), recurrent severe, without psychosis (HCC) Long Term Goal(s):     Short Term Goals:    Medication Management: Evaluate patient's response, side effects, and tolerance of medication regimen.  Therapeutic Interventions: 1 to 1 sessions, Unit Group sessions and Medication administration.  Evaluation of Outcomes: Not Progressing  Physician Treatment Plan for Secondary Diagnosis: Principal Problem:   MDD (major depressive disorder), recurrent severe, without psychosis (HCC)  Long Term Goal(s):     Short Term Goals:       Medication Management: Evaluate patient's response, side effects, and tolerance of medication regimen.  Therapeutic Interventions: 1 to 1 sessions, Unit Group sessions and Medication administration.  Evaluation of Outcomes: Not Progressing   RN Treatment Plan for Primary Diagnosis: MDD (major depressive disorder), recurrent severe, without psychosis (HCC) Long Term Goal(s): Knowledge of disease and therapeutic regimen to maintain health will improve  Short Term Goals: Ability to remain free from injury will improve, Ability to verbalize frustration and anger appropriately will improve, Ability to demonstrate self-control, Ability to participate in decision making will improve, Ability to verbalize feelings will improve, Ability to disclose and discuss suicidal ideas, Ability to identify and develop effective coping behaviors will improve, and Compliance with prescribed medications will improve  Medication Management: RN will administer medications as ordered by provider, will assess and evaluate patient's response and provide education to patient for prescribed medication. RN will report any adverse and/or side effects to prescribing provider.  Therapeutic Interventions: 1 on 1 counseling sessions,  Psychoeducation, Medication administration, Evaluate responses to treatment, Monitor vital signs and CBGs as ordered, Perform/monitor CIWA, COWS, AIMS and Fall Risk screenings as ordered, Perform wound care treatments as ordered.  Evaluation of Outcomes: Not Progressing   LCSW Treatment Plan for Primary Diagnosis: MDD (major depressive disorder), recurrent severe, without psychosis (HCC) Long Term Goal(s): Safe transition to appropriate next level of care at discharge, Engage patient in therapeutic group addressing interpersonal concerns.  Short Term Goals: Engage patient in aftercare planning with referrals and resources, Increase social support, Increase ability to appropriately verbalize feelings, Increase emotional regulation, and Increase skills for wellness and recovery  Therapeutic Interventions: Assess for all discharge needs, 1 to 1 time with Social worker, Explore available resources and support systems, Assess for adequacy in community support network, Educate family and significant other(s) on suicide prevention, Complete Psychosocial Assessment, Interpersonal group therapy.  Evaluation of Outcomes: Not Progressing   Progress in Treatment: Attending groups: Yes. Participating in groups: Yes. Taking medication as prescribed: Yes. Toleration medication: Yes. Family/Significant other contact made: No, will contact:  [name] [phone] Patient understands diagnosis: Yes. Discussing patient identified problems/goals with staff: Yes. Medical problems stabilized or resolved: Yes. Denies suicidal/homicidal ideation: No. Issues/concerns per patient self-inventory: No. Other: none reported  New problem(s) identified: No, Describe:  none reported   New Short Term/Long Term Goal(s): medication stabilization, elimination of SI thoughts, development of comprehensive mental wellness plan.    Patient Goals:  " I would like to work on having more emotional control, like to  have emotional intelligence, knowing and understanding my triggers"  Discharge Plan or Barriers: Patient recently admitted. CSW will continue to follow and assess for appropriate referrals and possible discharge planning.    Reason for Continuation of Hospitalization: Anxiety Depression Suicidal ideation  Estimated Length of Stay: 5-7 days  Last 3 [place] Suicide Severity Risk Score: Flowsheet Row [facility] from 04/02/2023 in [facility]  C-SSRS RISK CATEGORY High Risk High Risk       Last [facility] 2/9 Scores:     No data to display          Scribe for Treatment Team: Kathrynn Humble 04/04/2023 2:25 PM

## 2023-04-04 NOTE — Group Note (Signed)
Recreation Therapy Group Note   Group Topic:Team Building  Group Date: 04/04/2023 Start Time: 0930 End Time: 0952 Facilitators: Cortana Vanderford-McCall, LRT,CTRS Location: [address] Dayroom   Group Topic: Communication, Team Building, Problem Solving  Goal Area(s) Addresses:  Patient will effectively work with peer towards shared goal.  Patient will identify skills used to make activity successful.  Patient will share challenges and verbalize solution-driven approaches used. Patient will identify how skills used during activity can be used to reach post d/c goals.   Intervention: STEM Activity   Group Description: [employer]. Patients were provided the following materials: 4 drinking straws, 5 rubber bands, 5 paper clips, 2 index cards and 2 drinking cups. Using the provided materials patients were asked to build a launching mechanism to launch a ping pong ball across the room, approximately 10 feet. Patients were divided into teams of 3-5. Instructions required all materials be incorporated into the device, functionality of items left to the peer group's discretion.  Education: Pharmacist, community, Scientist, physiological, Air cabin crew, Building control surveyor.   Education Outcome: Acknowledges education/In group clarification offered/Needs additional education.    Affect/Mood: Appropriate   Participation Level: Engaged   Participation Quality: Independent   Behavior: Appropriate   Speech/Thought Process: Focused   Insight: Good   Judgement: Good   Modes of Intervention: STEM Activity   Patient Response to Interventions:  Engaged   Education Outcome:  In group clarification offered    Clinical Observations/Individualized Feedback: Pt was bright and worked well with peers in creating and developing their launcher. Pt was focused and engaged throughout group.      Plan: Continue to engage patient in RT group sessions 2-3x/week.   Conswella Bruney-McCall, LRT,CTRS  04/04/2023  12:04 PM
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
## 2023-04-04 NOTE — [facility] Group Notes (Signed)
Spiritual care group facilitated by Chaplain Dyanne Carrel, [facility]  Group focused on topic of strength. Group members reflected on what thoughts and feelings emerge when they hear this topic. They then engaged in facilitated dialog around how strength is present in their lives. This dialog focused on representing what strength had been to them in their lives (images and patterns given) and what they saw as helpful in their life now (what they needed / wanted).  Activity drew on narrative framework.  Patient Progress: Blake Ellis attended group and actively engaged and participated in group conversation and activities.  His comments demonstrated good insight and contributed positively to the group conversation.

## 2023-04-04 NOTE — Progress Notes (Signed)
   04/03/23 2100  Psych Admission Type (Psych Patients Only)  Admission Status Voluntary  Psychosocial Assessment  Patient Complaints Anxiety  Eye Contact Fair  Facial Expression Anxious  Affect Appropriate to circumstance  Speech Logical/coherent  Interaction Assertive  Motor Activity Other (Comment) (Unremarkable)  Appearance/Hygiene Unremarkable  Behavior Characteristics Cooperative  Mood Pleasant  Thought Process  Coherency Circumstantial  Content Other (Comment)  Delusions None reported or observed  Perception WDL  Hallucination None reported or observed  Judgment WDL  Confusion None  Danger to Self  Current suicidal ideation? Denies  Agreement Not to Harm Self Yes  Description of Agreement Verbal  Danger to Others  Danger to Others None reported or observed

## 2023-04-04 NOTE — [facility] Group Notes (Signed)
[facility] Group Notes:  (Nursing/MHT/Case Management/Adjunct)  Date:  04/04/2023  Time:  2000  Type of Therapy:   Wrap up group  Participation Level:  Active  Participation Quality:  Appropriate, Attentive, Sharing, and Supportive  Affect:  Appropriate  Cognitive:  Alert  Insight:  Improving  Engagement in Group:  Engaged  Modes of Intervention:  Clarification, Education, and Support  Summary of Progress/Problems: Positive thinking and positive change were discussed.   Blake Ellis 04/04/2023, 9:40 PM

## 2023-04-04 NOTE — H&P (Addendum)
Psychiatric Admission Assessment Adult  Patient Identification: Blake Ellis MRN:  [id number] Date of Evaluation:  04/04/2023 Chief Complaint:  MDD (major depressive disorder), recurrent severe, without psychosis (HCC) [F33.2] Principal Diagnosis: MDD (major depressive disorder), recurrent severe, without psychosis (HCC) Diagnosis:  Principal Problem:   MDD (major depressive disorder), recurrent severe, without psychosis (HCC) GAD  CC: " On Monday 04-02-23, I had a mental breakdown and attempted to overdose on Wellbutrin and guanfacine I had at home but my fianc walked in on me."  History of Present Illness:  This is the first psychiatric admission in this [facility] for this 22 year old AA male with hx of MDD, recurrent severe & marijuana use disorder. Admitted to the [facility] from the [facility] with complaint of suicidal ideations with plan to overdose on Wellbutrin and Intuniv, in the context of school stress, financial stressors, an altercation with his fiance.  After medical evaluation/stabilization & clearance, he was transferred to the [facility] for further psychiatric evaluation & treatments.   During this assessment patient reports the following: "On Monday 04-02-23, I had a mental health breakdown and attempted to take my Wellbutrin and guanfacine that was prescribed for me at [facility] clinic 2 years ago, when my fianc walked into the room.  When she saw me with the pills in my hands, she requested for me to be evaluated.  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 nightly to help me sleep and reduce my anxiety.  I had been suffering from depression since I was in seventh grade.  At that time I received outpatient therapy, which was long time ago. Since then, I do not have any therapist or a psychiatrist.  I was not  officially diagnosed with depression, however the provider at [facility] prescribed Wellbutrin and guanfacine for my mood."  Patient reports sad mood, lack of motivation, fatigue, lack of interest, anhedonia, SI, and anxiety for the past month.  Reports using marijuana for sleep and anxiety for 3 years.  Patient also reports excessive anxiety, worrying most of the days with difficulty controlling his worries, report restlessness and sometimes thought blocking.  He reports a history of pulling his hair and twisting his hair since he was in seventh grade.  Reports that he continues to pull and twist his hair up to now, which could be resulting from anxiety.  Patient also reports history of expansive mood, high energy, goal directed activities, and increased marijuana use for a period of 2 to 3 days last year.  This does not meet the criteria for bipolar disorder.  He denies symptoms of PTSD or psychosis.  However, added that last week he heard some people calling his name once or twice.  Evaluation: Patient is seen and examined on the unit sitting up in a chair.  He is alert, slightly restless, and oriented to person, place, time, and situation.  Chart reviewed and findings shared with the treatment team and consult with attending psychiatrist.  Speech is clear and coherent with normal volume and pattern.  He is able to maintain good eye contact with this provider and participating effectively in answering assessment questions.  Obviously not responding to internal or external stimuli.  Thought content and thought processes coherent and relevant.  He currently denies SI, HI, or AVH.  Vital signs reviewed within normal limits.  Admission labs and EKG reviewed as indicated in the treatment plan.  Patient is admitted for mood stabilization, medication management, and safety and safety.  Mode of transport to Hospital: Safe transport Current Outpatient (Home) Medication List: Wellbutrin and guanfacine PRN medication  prior to evaluation: None  ED course: Labs and EKG were obtained and analyzedat [facility].  Patient was stabilized and transferred to [facility] for further psychiatric evaluation and treatment Collateral Information: None obtained at this time POA/Legal Guardian: Patient is his own legal guardian  Past Psychiatric Hx: Previous Psych Diagnoses: MDD, GAD Prior inpatient treatment: Denies Current/prior outpatient treatment: Yes at [facility] medical Prior rehab hx: Yes, saw her outpatient therapist when he was in seventh grade Psychotherapy hx: Yes History of suicide: No, however have suicidal ideations History of homicide or aggression: Denies Psychiatric medication history: Patient was started on Wellbutrin and guanfacine 2 years ago Psychiatric medication compliance history: Noncompliance Neuromodulation history: Denies Current Psychiatrist: Denies Current therapist: Denies  Substance Abuse Hx: Alcohol: Denies drinking alcohol Tobacco: Denies smoking cigarettes Illicit drugs: Endorses smoking 3-4 blunts of marijuana daily.  Denies any other illegal drug use Rx drug abuse: Denies Rehab hx: Denies  Past Medical History: Medical Diagnoses: Denies Home Rx: Denies Prior Hosp: Yes, first year of high school was hospitalized for fracture of right femur Prior Surgeries/Trauma: Yes, her first year in high school Head trauma, LOC, concussions, seizures: Denies Allergies: No known drug allergies.  However patient allergic to shellfish with anaphylaxis reaction LMP: Not applicable Contraception: Not applicable PCP: Denies  Family History: Medical: Hypertension, diabetes, history of cancer Psych: Grandmother diagnosed with anxiety and depression, father diagnosed with bipolar disorder and major depressive disorder, paternal side of family has history of autism spectrum. 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  Lives with his fiance Abuse: Denies history of abuse Marital Status: Engaged Sexual orientation: Male from birth Children: No children Employment: Employed Peer Group: Denies peer group Housing: Lives with his fiance Finances: Some financial distress Legal: Denies Scientist, physiological: Denies service in the [employer]  Associated Signs/Symptoms: Depression Symptoms:  depressed mood, anhedonia, fatigue, hopelessness, suicidal thoughts with specific plan, anxiety, loss of energy/fatigue, (Hypo) Manic Symptoms:  Impulsivity, Anxiety Symptoms:  Excessive Worry, Psychotic Symptoms:   Patient denies PTSD Symptoms: NA Total Time spent with patient: 1 hour  Is the patient at risk to self? Yes.    Has the patient been a risk to self in the past 6 months? Yes.    Has the patient been a risk to self within the distant past? Yes.    Is the patient a risk to others? No.  Has the patient been a risk to others in the past 6 months? No.  Has the patient been a risk to others within the distant past? No.   [place] Scale:  Flowsheet Row [facility]) [facility]02/2023 in [facility]  C-SSRS RISK CATEGORY High Risk High Risk      Alcohol Screening: 1. How often do you have a drink containing alcohol?: Never 2. How many drinks containing alcohol do you have on a typical day when you are drinking?: 1 or 2 3. How often do you have six or more drinks on one occasion?: Never AUDIT-C Score: 0 4. How often during  the last year have you found that you were not able to stop drinking once you had started?: Never 5. How often during the last year have you failed to do what was normally expected from you because of drinking?: Never 6. How often during the last year have you needed a first  drink in the morning to get yourself going after a heavy drinking session?: Never 7. How often during the last year have you had a feeling of guilt of remorse after drinking?: Never 8. How often during the last year have you been unable to remember what happened the night before because you had been drinking?: Never 9. Have you or someone else been injured as a result of your drinking?: No 10. Has a relative or friend or a doctor or another health worker been concerned about your drinking or suggested you cut down?: No Alcohol Use Disorder Identification Test Final Score (AUDIT): 0 Alcohol Brief Interventions/Follow-up: Alcohol education/Brief advice  Substance Abuse History in the last 12 months:  Yes.   Consequences of Substance Abuse: Discussed with patient during this admission evaluation. Medical Consequences:  Liver damage, Possible death by overdose Legal Consequences:  Arrests, jail time, Loss of driving privilege. Family Consequences:  Family discord, divorce and or separation.  Previous Psychotropic Medications: Yes  Psychological Evaluations: Yes  Past Medical History: History reviewed. No pertinent past medical history. History reviewed. No pertinent surgical history. Family History: History reviewed. No pertinent family history. Tobacco Screening:  Social History   Tobacco Use  Smoking Status Never  Smokeless Tobacco Never    BH Tobacco Counseling     Are you interested in Tobacco Cessation Medications?  N/A, patient does not use tobacco products Counseled patient on smoking cessation:  N/A, patient does not use tobacco products Reason Tobacco Screening Not Completed: No value filed.   Social History:  Social History   Substance and Sexual Activity  Alcohol Use Not Currently     Social History   Substance and Sexual Activity  Drug Use Yes   Types: Marijuana   Comment: daily use for anxiety and sleep    Additional Social History: Marital status: Single Are  you sexually active?: Yes Does patient have children?: No     Allergies:   Allergies  Allergen Reactions   Shellfish Allergy Anaphylaxis   Lab Results:  Results for orders placed or performed during the hospital encounter of 04/02/23 (from the past 48 hour(s))  CBC with Differential/Platelet     Status: None   Collection Time: 04/02/23  9:45 PM  Result Value Ref Range   WBC 6.1 4.0 - 10.5 K/uL   RBC 5.21 4.22 - 5.81 MIL/uL   Hemoglobin 14.8 13.0 - 17.0 g/dL   HCT 78.2 95.6 - 21.3 %   MCV 87.5 80.0 - 100.0 fL   MCH 28.4 26.0 - 34.0 pg   MCHC 32.5 30.0 - 36.0 g/dL   RDW 08.6 57.8 - 46.9 %   Platelets 212 150 - 400 K/uL   nRBC 0.0 0.0 - 0.2 %   Neutrophils Relative % 45 %   Neutro Abs 2.8 1.7 - 7.7 K/uL   Lymphocytes Relative 44 %   Lymphs Abs 2.7 0.7 - 4.0 K/uL   Monocytes Relative 8 %   Monocytes Absolute 0.5 0.1 - 1.0 K/uL   Eosinophils Relative 2 %   Eosinophils Absolute 0.1 0.0 - 0.5 K/uL   Basophils Relative 1 %   Basophils Absolute 0.0 0.0 - 0.1 K/uL  Immature Granulocytes 0 %   Abs Immature Granulocytes 0.01 0.00 - 0.07 K/uL    Comment: Performed at [facility], [address], [place], [place] [address]  Comprehensive metabolic panel     Status: Abnormal   Collection Time: 04/02/23  9:45 PM  Result Value Ref Range   Sodium 141 135 - 145 mmol/L   Potassium 3.8 3.5 - 5.1 mmol/L   Chloride 106 98 - 111 mmol/L   CO2 24 22 - 32 mmol/L   Glucose, Bld 85 70 - 99 mg/dL    Comment: Glucose reference range applies only to samples taken after fasting for at least 8 hours.   BUN 12 6 - 20 mg/dL   Creatinine, Ser 6.04 0.61 - 1.24 mg/dL   Calcium 54.0 8.9 - 98.1 mg/dL   Total Protein 8.2 (H) 6.5 - 8.1 g/dL   Albumin 5.0 3.5 - 5.0 g/dL   AST 23 15 - 41 U/L   ALT 16 0 - 44 U/L   Alkaline Phosphatase 53 38 - 126 U/L   Total Bilirubin 0.6 <1.2 mg/dL   GFR, Estimated >19 >14 mL/min    Comment: (NOTE) Calculated using the CKD-EPI Creatinine Equation (2021)    Anion  gap 11 5 - 15    Comment: Performed at [facility], [address] [place], [place] [address]  Ethanol     Status: None   Collection Time: 04/02/23  9:45 PM  Result Value Ref Range   Alcohol, Ethyl (B) <10 <10 mg/dL    Comment: (NOTE) Lowest detectable limit for serum alcohol is 10 mg/dL.  For medical purposes only. Performed at [facility], [address], [place], [place] [address]   TSH     Status: None   Collection Time: 04/02/23  9:45 PM  Result Value Ref Range   TSH 1.886 0.350 - 4.500 uIU/mL    Comment: Performed by a 3rd Generation assay with a functional sensitivity of <=0.01 uIU/mL. Performed at [facility], [address], [place], [place] [address]    Blood Alcohol level:  Lab Results  Component Value Date   ETH <10 04/02/2023   Metabolic Disorder Labs:  No results found for: "HGBA1C", "MPG" No results found for: "PROLACTIN" No results found for: "CHOL", "TRIG", "HDL", "CHOLHDL", "VLDL", "LDLCALC"  Current Medications: Current Facility-Administered Medications  Medication Dose Route Frequency Provider Last Rate Last Admin   acetaminophen (TYLENOL) tablet 650 mg  650 mg Oral Q6H PRN Carrion-Carrero, Margely, MD       alum & mag hydroxide-simeth (MAALOX/MYLANTA) 200-200-20 MG/5ML suspension 30 mL  30 mL Oral Q4H PRN Carrion-Carrero, Margely, MD       diphenhydrAMINE (BENADRYL) capsule 50 mg  50 mg Oral Q6H PRN Carrion-Carrero, Margely, MD       Or   diphenhydrAMINE (BENADRYL) injection 50 mg  50 mg Intramuscular Q6H PRN Carrion-Carrero, Margely, MD       feeding supplement (ENSURE ENLIVE / ENSURE PLUS) liquid 237 mL  237 mL Oral BID BM Nkwenti, Doris, NP   237 mL at 04/04/23 1008   haloperidol (HALDOL) tablet 5 mg  5 mg Oral Q6H PRN Carrion-Carrero, Margely, MD       Or   haloperidol lactate (HALDOL) injection 5 mg  5 mg Intramuscular Q6H PRN Carrion-Carrero, Margely, MD       hydrOXYzine (ATARAX) tablet 25 mg  25 mg Oral TID PRN  Lorri Frederick, MD   25 mg at 04/03/23  1654   LORazepam (ATIVAN) tablet 1 mg  1 mg Oral Q6H PRN Carrion-Carrero, Margely, MD       Or   LORazepam (ATIVAN) injection 1 mg  1 mg Intramuscular Q6H PRN Carrion-Carrero, Margely, MD       magnesium hydroxide (MILK OF MAGNESIA) suspension 30 mL  30 mL Oral Daily PRN Carrion-Carrero, Margely, MD       traZODone (DESYREL) tablet 50 mg  50 mg Oral QHS PRN Lorri Frederick, MD   50 mg at 04/03/23 2102   PTA Medications: No medications prior to admission.   Musculoskeletal: Strength & Muscle Tone: within normal limits Gait & Station: normal Patient leans: N/A  Psychiatric Specialty Exam:  Presentation  General Appearance:  Appropriate for Environment; Casual; Fairly Groomed  Eye Contact: Good  Speech: Clear and Coherent  Speech Volume: Normal  Handedness: Right  Mood and Affect  Mood: Anxious; Depressed  Affect: Congruent  Thought Process  Thought Processes: Coherent; Linear  Duration of Psychotic Symptoms:N/A Past Diagnosis of Schizophrenia or Psychoactive disorder: No  Descriptions of Associations:Intact  Orientation:Full (Time, Place and Person)  Thought Content:Logical  Hallucinations:Hallucinations: None (Patient reports hearing his name called while at work within the past 1 week and no one was there.  Last set was last Wednesday, 7 days ago.)  Ideas of Reference:None  Suicidal Thoughts:Suicidal Thoughts: No SI Passive Intent and/or Plan: -- (Denies)  Homicidal Thoughts:Homicidal Thoughts: No  Sensorium  Memory: Immediate Good; Recent Good  Judgment: Fair  Insight: Fair  Art therapist  Concentration: Good  Attention Span: Good  Recall: Fair  Fund of Knowledge: Fair  Language: Good  Psychomotor Activity  Psychomotor Activity: Psychomotor Activity: Normal  Assets  Assets: Communication Skills; Desire for Improvement; Physical Health; Resilience; Social  Support  Sleep  Sleep: Sleep: Good Number of Hours of Sleep: 7  Physical Exam: Physical Exam Vitals and nursing note reviewed.  Constitutional:      Appearance: Normal appearance. He is normal weight.  HENT:     Head: Normocephalic.     Nose: Nose normal.     Mouth/Throat:     Mouth: Mucous membranes are moist.     Pharynx: Oropharynx is clear.  Eyes:     Extraocular Movements: Extraocular movements intact.  Cardiovascular:     Rate and Rhythm: Tachycardia present.  Pulmonary:     Effort: Pulmonary effort is normal.  Abdominal:     Comments: Deferred  Genitourinary:    Comments: Deferred Musculoskeletal:        General: Normal range of motion.     Cervical back: Normal range of motion.  Skin:    General: Skin is warm.  Neurological:     General: No focal deficit present.     Mental Status: He is alert and oriented to person, place, and time.  Psychiatric:        Mood and Affect: Mood normal.        Behavior: Behavior normal.        Thought Content: Thought content normal.    Review of Systems  Constitutional:  Negative for chills and fever.  HENT:  Negative for sore throat.   Eyes:  Negative for blurred vision.  Respiratory:  Negative for cough, sputum production, shortness of breath and wheezing.   Cardiovascular:  Negative for chest pain and palpitations.  Gastrointestinal:  Negative for abdominal pain, constipation, diarrhea, heartburn, nausea and vomiting.  Genitourinary:  Negative for dysuria, frequency and urgency.  Musculoskeletal:  Negative for back  pain, myalgias and neck pain.  Skin:  Negative for itching and rash.  Neurological:  Negative for dizziness, tingling, tremors, sensory change, seizures and headaches.  Endo/Heme/Allergies:        See allergy listing  Psychiatric/Behavioral:  Positive for depression and substance abuse. The patient is nervous/anxious.    Blood pressure 111/73, pulse (!) 101, temperature 97.8 F (36.6 C), temperature source  Oral, resp. rate 18, height 6' (1.829 m), weight 73.4 kg, SpO2 100%. Body mass index is 21.94 kg/m.  Treatment Plan Summary: Daily contact with patient to assess and evaluate symptoms and progress in treatment and Medication management  Physician Treatment Plan for Primary Diagnosis:   Assessment: This is the first psychiatric admission in this [facility] for this 22 year old AA male with hx of MDD, recurrent severe & marijuana use disorder. Admitted to the [facility] from the [facility] with complaint of suicidal ideations with plan to overdose on Wellbutrin and Intuniv, in the context of school stress, financial stressors, an altercation with his fiance.   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 daily for depression and anxiety Continue hydroxyzine tablet 25 mg p.o. 3 times daily as needed for anxiety Continue trazodone tablets 50 mg p.o. q. nightly as needed for insomnia Nicotine gum 2 mg as needed for smoking cessation Continue Ensure nutritional supplement 237 mL p.o. twice daily between meals  Agitation protocol: Benadryl capsule 50 mg p.o. or IM 3 times daily as needed agitation   Haldol tablets 5 mg po IM 3 times daily as needed agitation   Lorazepam tablet 2 mg p.o. or IM 3 times daily as needed agitation    Other PRN Medications -Acetaminophen 650 mg every 6 as needed/mild pain -Maalox 30 mL oral every 4 as needed/digestion -Magnesium hydroxide 30 mL daily as needed/mild constipation  -- The risks/benefits/side-effects/alternatives to this medication were discussed in detail with the patient and time was given for questions. The patient consents to medication trial.  -- Metabolic profile and EKG monitoring obtained while on an atypical antipsychotic (BMI: Lipid Panel: HbgA1c: QTc:)  -- Encouraged patient to participate in unit milieu and in scheduled group therapies   Admission labs reviewed:  CMP: Total protein 8.2 elevated, otherwise normal.  CBC with differential: Within normal limits.  TSH: 1.886 within normal limits.  BAL: Less than 10.  UDS: Positive for marijuana.  New labs ordered: Vitamin D25 hydroxy.  EKG reviewed: Sinus bradycardia, ventricular rate 58, QT/QTc 414/406   Safety and Monitoring: Voluntary admission to inpatient psychiatric unit for safety, stabilization and treatment Daily contact with patient to assess and evaluate symptoms and progress in treatment Patient's case to be discussed in multi-disciplinary team meeting Observation Level : q15 minute checks Vital signs: q12 hours Precautions: suicide, but pt currently verbally contracts for safety on unit    Discharge Planning: Social work and case management to assist with discharge planning and identification of hospital follow-up needs prior to discharge Estimated LOS: 5-7 days Discharge Concerns: Need to establish a safety plan; Medication compliance and effectiveness Discharge Goals: Return home with outpatient referrals for mental health follow-up including medication management/psychotherapy.   Long Term Goal(s): Improvement in symptoms so as ready for discharge  Short Term Goals: Ability to identify changes in lifestyle to reduce recurrence of condition will improve, Ability to verbalize feelings will improve, Ability to disclose and discuss suicidal ideas, Ability to demonstrate self-control will improve, Ability to identify and develop effective  coping behaviors will improve, Ability to maintain clinical measurements within normal limits will improve, Compliance with prescribed medications will improve, and Ability to identify triggers associated with substance abuse/mental health issues will improve  Physician Treatment Plan for Secondary Diagnosis: Principal Problem:   MDD (major depressive disorder), recurrent severe, without psychosis (HCC)  I certify that inpatient services furnished can  reasonably be expected to improve the patient's condition.    Cecilie Lowers, FNP 12/4/20242:56 PM

## 2023-04-04 NOTE — Group Note (Signed)
Date:  04/04/2023 Time:  4:09 PM  Group Topic/Focus:  Dimensions of Wellness:   The focus of this group is to introduce the topic of wellness and discuss the role each dimension of wellness plays in total health.    Participation Level:  Active  Participation Quality:  Appropriate  Affect:  Appropriate  Cognitive:  Appropriate  Insight: Appropriate  Engagement in Group:  Engaged  Modes of Intervention:  Activity, Discussion, and Socialization  Additional Comments:   Pt attended the Social Wellness group. Pt actively participated in the Telephone Game and verbalized the importance of listening and not spreading rumors or gossiping.  Blake Ellis 04/04/2023, 4:09 PM

## 2023-04-04 NOTE — Plan of Care (Signed)
  Problem: Education: Goal: Knowledge of [facility] Education information/materials will improve Outcome: Progressing Goal: Verbalization of understanding the information provided will improve Outcome: Progressing   Problem: Safety: Goal: Periods of time without injury will increase Outcome: Progressing   

## 2023-04-04 NOTE — Group Note (Unsigned)
Date:  04/05/2023 Time:  1:05 AM  Group Topic/Focus:  Narcotics Anonymous (NA) Meeting    Participation Level:  Did Not Attend  Participation Quality:   N/A  Affect:   N/A  Cognitive:   N/A  Insight: None  Engagement in Group:   N/A  Modes of Intervention:   N/A  Additional Comments:  Patient did not attend NA  Kennieth Francois 04/05/2023, 1:05 AM

## 2023-04-05 DIAGNOSIS — F332 Major depressive disorder, recurrent severe without psychotic features: Secondary | ICD-10-CM | POA: Diagnosis not present

## 2023-04-05 LAB — VITAMIN D 25 HYDROXY (VIT D DEFICIENCY, FRACTURES): Vit D, 25-Hydroxy: 9.95 ng/mL — ABNORMAL LOW (ref 30–100)

## 2023-04-05 MED ORDER — ONDANSETRON 4 MG PO TBDP: 4.0000 mg | ORAL_TABLET | Freq: Two times a day (BID) | ORAL | Status: DC

## 2023-04-05 MED ORDER — LOPERAMIDE HCL 2 MG PO CAPS
4.0000 mg | ORAL_CAPSULE | ORAL | Status: DC | PRN
  Administered 2023-04-05: 4 mg via ORAL
  Filled 2023-04-05 (×2): qty 2

## 2023-04-05 MED ORDER — ONDANSETRON 4 MG PO TBDP: 4.0000 mg | ORAL_TABLET | Freq: Three times a day (TID) | ORAL | Status: DC | PRN

## 2023-04-05 NOTE — Progress Notes (Signed)
..  Assumed care of patient last pm, he present A&O x 4, affect & mood. Was pleasant, thought process seemed clear and intact.Pt out in the milieu, sociable with peers  appropriate to situation and was with complaint of GI distress. Ppt was given anti-acid which seemed effect and he slept the remainder of the night. Pt denied thoughts, plan or intent to harm self or others and made a safety commitment, "I'm feeling much better"., he focused on his academic studies. Pt is compliant with taking medications and following his treatment plan. Pt educated on plan of care, medication regimen and he acknowledged an understanding and was without further complaints or concerns. Patient Is been maintained on q 15 min rounds for safety and support.

## 2023-04-05 NOTE — [facility] Suicide Risk Assessment (Signed)
[facility] INPATIENT:  Family/Significant Other Suicide Prevention Education  Suicide Prevention Education:  Education Completed; Etta Quill (fiance) [phone],  (name of family member/significant other) has been identified by the patient as the family member/significant other with whom the patient will be residing, and identified as the person(s) who will aid the patient in the event of a mental health crisis (suicidal ideations/suicide attempt).  With written consent from the patient, the family member/significant other has been provided the following suicide prevention education, prior to the and/or following the discharge of the patient.  Spoke with patient's fiancee, who reports that patient has "Cosplay swords." All but 1 of the swords are fake and not sharp. The one that is sharp, fiancee reports will be locked up before pt gets back home. Steffanie Rainwater got rid of all medications that are not needed/expired. There are no other weapons in the home, there are no firearms in the home.   Fiance will pick patient up at discharge.   The suicide prevention education provided includes the following: Suicide risk factors Suicide prevention and interventions National Suicide Hotline telephone number [facility] assessment telephone number [facility] Emergency Assistance 911 [facility] and/or Residential Mobile Crisis Unit telephone number  Request made of family/significant other to: Remove weapons (e.g., guns, rifles, knives), all items previously/currently identified as safety concern.   Remove drugs/medications (over-the-counter, prescriptions, illicit drugs), all items previously/currently identified as a safety concern.  The family member/significant other verbalizes understanding of the suicide prevention education information provided.  The family member/significant other agrees to remove the items of safety concern listed above.  Kathi Der 04/05/2023, 2:42 PM

## 2023-04-05 NOTE — Group Note (Signed)
LCSW Group Therapy Note  Group Date: 04/05/2023 Start Time: 1100 End Time: 1200   Type of Therapy and Topic:  Group Therapy - Healthy vs Unhealthy Coping Skills  Participation Level:  Did Not Attend   Description of Group The focus of this group was to determine what unhealthy coping techniques typically are used by group members and what healthy coping techniques would be helpful in coping with various problems. Patients were guided in becoming aware of the differences between healthy and unhealthy coping techniques. Patients were asked to identify 2-3 healthy coping skills they would like to learn to use more effectively.  Therapeutic Goals Patients learned that coping is what human beings do all day long to deal with various situations in their lives Patients defined and discussed healthy vs unhealthy coping techniques Patients identified their preferred coping techniques and identified whether these were healthy or unhealthy Patients determined 2-3 healthy coping skills they would like to become more familiar with and use more often. Patients provided support and ideas to each other   Summary of Patient Progress:  Did not attend   Therapeutic Modalities Cognitive Behavioral Therapy Motivational Interviewing  Marinda Elk, [place] 04/05/2023  12:25 PM

## 2023-04-05 NOTE — Progress Notes (Signed)
The patient came out of his room at about 0200 and stated that he had experienced both nausea and diarrhea. The nurse has been notified.

## 2023-04-05 NOTE — Group Note (Signed)
Date:  04/05/2023 Time:  10:54 AM  Group Topic/Focus:  Goals Group:   The focus of this group is to help patients establish daily goals to achieve during treatment and discuss how the patient can incorporate goal setting into their daily lives to aide in recovery. Orientation:   The focus of this group is to educate the patient on the purpose and policies of crisis stabilization and provide a format to answer questions about their admission.  The group details unit policies and expectations of patients while admitted.    Participation Level:  Active  Participation Quality:  Appropriate  Affect:  Appropriate  Cognitive:  Appropriate  Insight: Appropriate  Engagement in Group:  Engaged  Modes of Intervention:  Discussion  Additional Comments:    Kennidi Yoshida D Shanetta Nicolls 04/05/2023, 10:54 AM

## 2023-04-05 NOTE — Progress Notes (Signed)
Pt A & O X4. Denies SI, HI, AVh and pain when assessed. Reports multiple loose stools last evening and this morning. Placed on enteric precaution and stool sample obtained as ordered. Received PRN Imodium 4 mg PO at 1329 with desired effect.  Safety maintained at Q 15 minutes intervals. Compliant with scheduled medications and cooperative with care this shift. Tolerates meals and fluids well. Emotional support, encouragement and reassurance offered.

## 2023-04-05 NOTE — Plan of Care (Signed)
  Problem: Activity: Goal: Sleeping patterns will improve Outcome: Progressing   Problem: Coping: Goal: Ability to verbalize frustrations and anger appropriately will improve Outcome: Progressing   Problem: Physical Regulation: Goal: Ability to maintain clinical measurements within normal limits will improve Outcome: Progressing
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
## 2023-04-05 NOTE — [facility] Group Notes (Signed)
Psychoeducational Group Note  Date:  04/05/2023 Time:  2000  Group Topic/Focus:  Wrap up group  Participation Level: Did Not Attend  Participation Quality:  Not Applicable  Affect:  Not Applicable  Cognitive:  Not Applicable  Insight:  Not Applicable  Engagement in Group: Not Applicable  Additional Comments:  Did not attend.   Marcille Buffy 04/05/2023, 9:20 PM

## 2023-04-06 DIAGNOSIS — F332 Major depressive disorder, recurrent severe without psychotic features: Secondary | ICD-10-CM | POA: Diagnosis not present

## 2023-04-06 MED ORDER — VITAMIN D (ERGOCALCIFEROL) 1.25 MG (50000 UNIT) PO CAPS
50000.0000 [IU] | ORAL_CAPSULE | ORAL | Status: DC
  Administered 2023-04-06: 50000 [IU] via ORAL
  Filled 2023-04-06: qty 1

## 2023-04-06 NOTE — Progress Notes (Addendum)
   04/06/23 0900  Psych Admission Type (Psych Patients Only)  Admission Status Voluntary  Psychosocial Assessment  Patient Complaints None  Eye Contact Fair  Facial Expression Anxious  Affect Appropriate to circumstance  Speech Logical/coherent  Interaction Assertive  Motor Activity Other (Comment) (WDL)  Appearance/Hygiene Unremarkable  Behavior Characteristics Cooperative  Mood Pleasant  Thought Process  Coherency Circumstantial  Content Preoccupation  Delusions None reported or observed  Perception WDL  Hallucination None reported or observed  Judgment Impaired  Confusion None  Danger to Self  Current suicidal ideation? Denies  Agreement Not to Harm Self Yes  Description of Agreement verbal   Patient alert and oriented. Patient denies SI, HI, AVH, and pain. Scheduled medication administered to patient, per provider orders. Support and encouragement provided. Routine safety checks conducted every 15 minutes. Patient verbally contracts for safety.

## 2023-04-06 NOTE — Progress Notes (Signed)
   04/06/23 0603  15 Minute Checks  Location Bedroom  Visual Appearance Calm  Behavior Sleeping  Sleep (Behavioral Health Patients Only)  Calculate sleep? (Click Yes once per 24 hr at 0600 safety check) Yes  Documented sleep last 24 hours 8.5

## 2023-04-06 NOTE — Progress Notes (Signed)
Patient provided with pitcher of gatorade and encouraged to drink. Patient verbalized understanding and importance of hydration.

## 2023-04-06 NOTE — Plan of Care (Signed)
  Problem: Education: Goal: Emotional status will improve Outcome: Progressing Goal: Mental status will improve Outcome: Progressing   Problem: Safety: Goal: Periods of time without injury will increase Outcome: Progressing   Problem: Activity: Goal: Interest or engagement in activities will improve Outcome: Progressing

## 2023-04-06 NOTE — Progress Notes (Signed)
   04/05/23 2230  Psych Admission Type (Psych Patients Only)  Admission Status Voluntary  Psychosocial Assessment  Patient Complaints None  Eye Contact Fair  Facial Expression Anxious  Affect Appropriate to circumstance  Speech Logical/coherent  Interaction Assertive  Motor Activity Fidgety  Appearance/Hygiene Unremarkable  Behavior Characteristics Cooperative  Mood Anxious;Pleasant  Thought Process  Coherency Circumstantial  Content Preoccupation  Delusions None reported or observed  Perception WDL  Hallucination None reported or observed  Judgment Impaired  Confusion None  Danger to Self  Current suicidal ideation? Denies  Agreement Not to Harm Self Yes  Description of Agreement verbal  Danger to Others  Danger to Others None reported or observed
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
## 2023-04-06 NOTE — Plan of Care (Signed)
  Problem: Medication: Goal: Compliance with prescribed medication regimen will improve Outcome: Progressing   Problem: Safety: Goal: Ability to remain free from injury will improve Outcome: Progressing   

## 2023-04-06 NOTE — Group Note (Signed)
Recreation Therapy Group Note   Group Topic:Problem Solving  Group Date: 04/06/2023 Start Time: 0930 End Time: 0950 Facilitators: Baylea Milburn-McCall, LRT,CTRS Location: [address] Dayroom   Group Topic: Communication, Team Building, Problem Solving  Goal Area(s) Addresses:  Patient will effectively work with peer towards shared goal.  Patient will identify skills used to make activity successful.  Patient will identify how skills used during activity can be applied to reach post d/c goals.   Intervention: STEM Activity- Glass blower/designer  Group Description: Tallest Pharmacist, community. In teams of 5-6, patients were given 11 craft pipe cleaners. Using the materials provided, patients were instructed to compete again the opposing team(s) to build the tallest free-standing structure from floor level. The activity was timed; difficulty increased by Clinical research associate as Production designer, theatre/television/film continued.  Systematically resources were removed with additional directions for example, placing one arm behind their back, working in silence, and shape stipulations. LRT facilitated post-activity discussion reviewing team processes and necessary communication skills involved in completion. Patients were encouraged to reflect how the skills utilized, or not utilized, in this activity can be incorporated to positively impact support systems post discharge.  Education: Pharmacist, community, Scientist, physiological, Discharge Planning   Education Outcome: Acknowledges education/In group clarification offered/Needs additional education.    Affect/Mood: N/A   Participation Level: Did not attend    Clinical Observations/Individualized Feedback:     Plan: Continue to engage patient in RT group sessions 2-3x/week.   Karder Goodin-McCall, LRT,CTRS 04/06/2023 11:48 AM

## 2023-04-07 DIAGNOSIS — F411 Generalized anxiety disorder: Secondary | ICD-10-CM | POA: Insufficient documentation

## 2023-04-07 DIAGNOSIS — F332 Major depressive disorder, recurrent severe without psychotic features: Secondary | ICD-10-CM | POA: Diagnosis not present

## 2023-04-07 LAB — GASTROINTESTINAL PANEL BY PCR, STOOL (REPLACES STOOL CULTURE)

## 2023-04-07 LAB — HEMOGLOBIN A1C
Hgb A1c MFr Bld: 5.7 % — ABNORMAL HIGH (ref 4.8–5.6)
Mean Plasma Glucose: 116.89 mg/dL

## 2023-04-07 MED ORDER — TRAZODONE HCL 50 MG PO TABS: 50.0000 mg | ORAL_TABLET | Freq: Every evening | ORAL | 0 refills | Status: DC | PRN

## 2023-04-07 MED ORDER — HYDROXYZINE HCL 25 MG PO TABS: 25.0000 mg | ORAL_TABLET | Freq: Three times a day (TID) | ORAL | 0 refills | Status: DC | PRN

## 2023-04-07 MED ORDER — FLUOXETINE HCL 20 MG PO CAPS: 20.0000 mg | ORAL_CAPSULE | Freq: Every day | ORAL | 0 refills | Status: DC

## 2023-04-07 MED ORDER — VITAMIN D (ERGOCALCIFEROL) 1.25 MG (50000 UNIT) PO CAPS: 50000.0000 [IU] | ORAL_CAPSULE | ORAL | 0 refills | Status: AC

## 2023-04-07 MED ORDER — NICOTINE POLACRILEX 2 MG MT GUM: 2.0000 mg | CHEWING_GUM | OROMUCOSAL | Status: AC | PRN

## 2023-04-07 NOTE — Plan of Care (Signed)
Problem: Education: Goal: Knowledge of [facility] Education information/materials will improve 04/07/2023 0950 by Lovena Neighbours, RN Outcome: Adequate for Discharge 04/07/2023 [phone] by Lovena Neighbours, RN Outcome: Progressing Goal: Emotional status will improve 04/07/2023 0950 by Lovena Neighbours, RN Outcome: Adequate for Discharge 04/07/2023 0927 by Lovena Neighbours, RN Outcome: Progressing Goal: Mental status will improve 04/07/2023 0950 by Lovena Neighbours, RN Outcome: Adequate for Discharge 04/07/2023 [phone] by Lovena Neighbours, RN Outcome: Progressing Goal: Verbalization of understanding the information provided will improve 04/07/2023 0950 by Lovena Neighbours, RN Outcome: Adequate for Discharge 04/07/2023 0927 by Lovena Neighbours, RN Outcome: Progressing   Problem: Activity: Goal: Interest or engagement in activities will improve Outcome: Adequate for Discharge Goal: Sleeping patterns will improve Outcome: Adequate for Discharge   Problem: Coping: Goal: Ability to verbalize frustrations and anger appropriately will improve 04/07/2023 0950 by Lovena Neighbours, RN Outcome: Adequate for Discharge 04/07/2023 0927 by Lovena Neighbours, RN Outcome: Progressing Goal: Ability to demonstrate self-control will improve Outcome: Adequate for Discharge   Problem: Health Behavior/Discharge Planning: Goal: Identification of resources available to assist in meeting health care needs will improve Outcome: Adequate for Discharge Goal: Compliance with treatment plan for underlying cause of condition will improve Outcome: Adequate for Discharge   Problem: Safety: Goal: Periods of time without injury will increase Outcome: Adequate for Discharge   Problem: Education: Goal: Knowledge of [facility] Education information/materials will improve Outcome: Adequate for Discharge Goal: Emotional status will improve Outcome: Adequate for Discharge Goal: Mental status will improve Outcome: Adequate  for Discharge Goal: Verbalization of understanding the information provided will improve Outcome: Adequate for Discharge   Problem: Coping: Goal: Ability to verbalize frustrations and anger appropriately will improve Outcome: Adequate for Discharge Goal: Ability to demonstrate self-control will improve Outcome: Adequate for Discharge   Problem: Activity: Goal: Will identify at least one activity in which they can participate Outcome: Adequate for Discharge   Problem: Coping: Goal: Ability to identify and develop effective coping behavior will improve Outcome: Adequate for Discharge Goal: Ability to interact with others will improve Outcome: Adequate for Discharge Goal: Demonstration of participation in decision-making regarding own care will improve Outcome: Adequate for Discharge Goal: Ability to use eye contact when communicating with others will improve Outcome: Adequate for Discharge   Problem: Safety: Goal: Periods of time without injury will increase Outcome: Adequate for Discharge   Problem: Health Behavior/Discharge Planning: Goal: Identification of resources available to assist in meeting health care needs will improve Outcome: Adequate for Discharge   Problem: Education: Goal: Ability to make informed decisions regarding treatment will improve Outcome: Adequate for Discharge   Problem: Medication: Goal: Compliance with prescribed medication regimen will improve Outcome: Adequate for Discharge   Problem: Health Behavior/Discharge Planning: Goal: Identification of resources available to assist in meeting health care needs will improve Outcome: Adequate for Discharge   Problem: Self-Concept: Goal: Ability to disclose and discuss suicidal ideas will improve Outcome: Adequate for Discharge Goal: Will verbalize positive feelings about self Outcome: Adequate for Discharge   Problem: Health Behavior/Discharge Planning: Goal: Ability to identify changes in  lifestyle to reduce recurrence of condition will improve Outcome: Adequate for Discharge Goal: Identification of resources available to assist in meeting health care needs will improve Outcome: Adequate for Discharge   Problem: Safety: Goal: Ability to remain free from injury will improve Outcome: Adequate for Discharge   Problem: Self-Concept: Goal: Ability to identify factors that promote anxiety will improve Outcome:  Adequate for Discharge Goal: Level of anxiety will decrease Outcome: Adequate for Discharge Goal: Ability to modify response to factors that promote anxiety will improve Outcome: Adequate for Discharge

## 2023-04-07 NOTE — Discharge Instructions (Signed)

## 2023-04-07 NOTE — Progress Notes (Signed)
D: Patient verbalizes readiness for discharge, denies suicidal and homicidal ideations, denies auditory and visual hallucinations.  No complaints of pain. Suicide Safety Plan completed and copy placed in the chart.  A:  Patient receptive to discharge instructions. Questions encouraged, pt verbalized understanding.   R:  Escorted to the lobby by this RN. Mother and brother waiting.

## 2023-04-07 NOTE — Progress Notes (Signed)
  [facility] Adult Case Management Discharge Plan :  Will you be returning to the same living situation after discharge:  Yes,  with significant other At discharge, do you have transportation home?: Yes,  Mother Do you have the ability to pay for your medications: Yes,  pt has medical coverage  Patient to Follow up at:  Follow-up Information     [facility] Follow up.   Specialty: Professional Counselor Why: You may go to this provider for therapy services on Monday through Friday, from 9 am to 1 pm for new patients. Contact information: [address] [place] [address] [phone]         [facility]. Go on 04/17/2023.   Specialty: Behavioral Health Why: Please go to this provider on 04/17/23 at 7:00 am for medication management services. For fastest service, you may also go on Monday through Friday, arrive by 7:00 am for same day service. Contact information: [address] [place] [address] [phone]                Next level of care provider has access to [facility] Link:yes  Safety Planning and Suicide Prevention discussed: Yes,  Completed with significant other     Has patient been referred to the Quitline?: Patient does not use tobacco/nicotine products  Patient has been referred for addiction treatment: No known substance use disorder.  Bridgett Larsson, LCSW 04/07/2023, 10:40 AM
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
## 2023-04-07 NOTE — Plan of Care (Signed)
  Problem: Education: Goal: Emotional status will improve Outcome: Progressing Goal: Mental status will improve Outcome: Progressing   

## 2023-04-07 NOTE — Plan of Care (Signed)
  Problem: Education: Goal: Knowledge of [facility] Education information/materials will improve Outcome: Progressing Goal: Emotional status will improve Outcome: Progressing Goal: Mental status will improve Outcome: Progressing Goal: Verbalization of understanding the information provided will improve Outcome: Progressing   Problem: Coping: Goal: Ability to verbalize frustrations and anger appropriately will improve Outcome: Progressing
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
## 2023-04-07 NOTE — Progress Notes (Signed)
   04/06/23 2040  Psych Admission Type (Psych Patients Only)  Admission Status Voluntary  Psychosocial Assessment  Patient Complaints None  Eye Contact Fair  Facial Expression Anxious  Affect Appropriate to circumstance  Speech Logical/coherent  Interaction Assertive  Motor Activity Restless  Appearance/Hygiene Unremarkable  Behavior Characteristics Cooperative  Mood Pleasant  Thought Process  Coherency WDL  Content WDL  Delusions None reported or observed  Perception WDL  Hallucination None reported or observed  Judgment Impaired  Confusion None  Danger to Self  Current suicidal ideation? Denies

## 2023-04-10 LAB — NOROVIRUS GROUP 1 & 2 BY PCR, STOOL

## 2023-04-17 ENCOUNTER — Encounter (HOSPITAL_COMMUNITY): Payer: Self-pay | Admitting: Physician Assistant

## 2023-04-17 ENCOUNTER — Ambulatory Visit (INDEPENDENT_AMBULATORY_CARE_PROVIDER_SITE_OTHER): Payer: Medicaid Other | Admitting: Physician Assistant

## 2023-04-17 VITALS — BP 130/88 | HR 92 | Ht 71.0 in | Wt 157.0 lb

## 2023-04-17 DIAGNOSIS — F411 Generalized anxiety disorder: Secondary | ICD-10-CM | POA: Diagnosis not present

## 2023-04-17 DIAGNOSIS — F332 Major depressive disorder, recurrent severe without psychotic features: Secondary | ICD-10-CM

## 2023-04-17 MED ORDER — FLUOXETINE HCL 20 MG PO CAPS: 20.0000 mg | ORAL_CAPSULE | Freq: Every day | ORAL | 1 refills | Status: DC

## 2023-04-17 MED ORDER — TRAZODONE HCL 50 MG PO TABS: 50.0000 mg | ORAL_TABLET | Freq: Every evening | ORAL | 1 refills | Status: DC | PRN

## 2023-04-17 MED ORDER — HYDROXYZINE HCL 25 MG PO TABS: 25.0000 mg | ORAL_TABLET | Freq: Three times a day (TID) | ORAL | 1 refills | Status: DC | PRN

## 2023-04-17 NOTE — Progress Notes (Unsigned)
Psychiatric Initial Adult Assessment   Patient Identification: Blake Ellis MRN:  [id number] Date of Evaluation:  04/17/2023 Referral Source: Follow up appointment following discharge from [facility] Chief Complaint:   Chief Complaint  Patient presents with   Follow-up   Medication Refill   Visit Diagnosis:    ICD-10-CM   1. GAD (generalized anxiety disorder)  F41.1 hydrOXYzine (ATARAX) 25 MG tablet    FLUoxetine (PROZAC) 20 MG capsule    2. 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.2 traZODone (DESYREL) 50 MG tablet    FLUoxetine (PROZAC) 20 MG capsule      History of Present Illness:  ***  [name]  Associated Signs/Symptoms: Depression Symptoms:  psychomotor agitation, suicidal attempt, anxiety, (Hypo) Manic Symptoms:  Distractibility, Flight of Ideas, Irritable Mood, Anxiety Symptoms:  Obsessive Compulsive Symptoms:   patient reports that he has a tendency to twist his hair., Social Anxiety, Psychotic Symptoms:   Patient denies PTSD Symptoms: Negative  Past Psychiatric History:  Patient was recently diagnosed with major depressive disorder and generalized anxiety disorder after being admitted to [facility].  Patient endorses a past history of hospitalization due to mental health  - Patient was recently admitted to [facility] on 04/23/2023  Patient endorses a past history of suicide attempt.  Prior to his recent hospitalization at [facility], patient exhibited suicidal ideations with a plan to overdose on Wellbutrin and Intuniv  Patient denies a past history of homicide attempt  Previous Psychotropic Medications: No   Substance Abuse History in the last 12 months:  No.  Consequences of Substance Abuse: Patient endorses a past history of marijuana use  Medical Consequences:  Patient denies Legal Consequences:  Patient denies Family Consequences:  Patient denies Blackouts:   Patient denies DT's: Patient denies Withdrawal Symptoms:   None  Past Medical History: History reviewed. No pertinent past medical history. History reviewed. No pertinent surgical history.  Family Psychiatric History:  Father - bipolar disorder Brothers - autism Grandmother (maternal) - depression and anxiety  Family history of suicide attempt: Patient denies Family history of homicide attempt: Patient denies Family history of substance abuse: Patient reports that his father abused marijuana  Family History: History reviewed. No pertinent family history.  Social History:   Social History   Socioeconomic History   Marital status: Significant Other    Spouse name: Not on file   Number of children: Not on file   Years of education: Not on file   Highest education level: Not on file  Occupational History   Not on file  Tobacco Use   Smoking status: Never   Smokeless tobacco: Never  Substance and Sexual Activity   Alcohol use: Not Currently   Drug use: Yes    Types: Marijuana    Comment: daily use for anxiety and sleep   Sexual activity: Not on file  Other Topics Concern   Not on file  Social History Narrative   Pt states "I live with my fiancee here in [place]. 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 We've been engaged for 1.5 year now".   Social Drivers of Corporate investment banker Strain: Not on file  Food Insecurity: Food Insecurity Present (04/03/2023)   Hunger Vital Sign    Worried About Running Out of Food in the Last Year: Sometimes true    Ran Out of Food in the Last Year: Sometimes true  Transportation Needs: No Transportation Needs (04/03/2023)   PRAPARE - Transportation  Lack of Transportation (Medical): No    Lack of Transportation (Non-Medical): No  Physical Activity: Not on file  Stress: Not on file  Social Connections: Not on file    Additional Social History:  Patient endorses social support.  Patient denies having children of his own.  Patient  endorses housing.  Patient is currently employed and works at an [employer].  Patient denies a past history of military experience.  Patient denies a past history of prison or jail time.  Patient is currently attending college and is doing his third year.  Patient denies access to weapons.  Allergies:   Allergies  Allergen Reactions   Shellfish Allergy Anaphylaxis    Metabolic Disorder Labs: Lab Results  Component Value Date   HGBA1C 5.7 (H) 04/07/2023   MPG 116.89 04/07/2023   No results found for: "PROLACTIN" No results found for: "CHOL", "TRIG", "HDL", "CHOLHDL", "VLDL", "LDLCALC" Lab Results  Component Value Date   TSH 1.886 04/02/2023    Therapeutic Level Labs: No results found for: "LITHIUM" No results found for: "CBMZ" No results found for: "VALPROATE"  Current Medications: Current Outpatient Medications  Medication Sig Dispense Refill   FLUoxetine (PROZAC) 20 MG capsule Take 1 capsule (20 mg total) by mouth daily. 30 capsule 1   hydrOXYzine (ATARAX) 25 MG tablet Take 1 tablet (25 mg total) by mouth 3 (three) times daily as needed for anxiety. 75 tablet 1   nicotine polacrilex (NICORETTE) 2 MG gum Take 1 each (2 mg total) by mouth as needed for smoking cessation.     traZODone (DESYREL) 50 MG tablet Take 1 tablet (50 mg total) by mouth at bedtime as needed for sleep. 30 tablet 1   Vitamin D, Ergocalciferol, (DRISDOL) 1.25 MG (50000 UNIT) CAPS capsule Take 1 capsule (50,000 Units total) by mouth every 7 (seven) days for 5 doses. 5 capsule 0   No current facility-administered medications for this visit.    Musculoskeletal: Strength & Muscle Tone: within normal limits Gait & Station: normal Patient leans: N/A  Psychiatric Specialty Exam: Review of Systems  Psychiatric/Behavioral:  Negative for decreased concentration, dysphoric mood, hallucinations, self-injury, sleep disturbance and suicidal ideas. The patient is not nervous/anxious and is not hyperactive.     Blood  pressure 130/88, pulse 92, height 5\' 11"  (1.803 m), weight 157 lb (71.2 kg), SpO2 100%.Body mass index is 21.9 kg/m.  General Appearance: Casual  Eye Contact:  Good  Speech:  Clear and Coherent and Normal Rate  Volume:  Normal  Mood:  Euthymic  Affect:  Appropriate  Thought Process:  Coherent, Goal Directed, and Descriptions of Associations: Intact  Orientation:  Full (Time, Place, and Person)  Thought Content:  WDL  Suicidal Thoughts:  No  Homicidal Thoughts:  No  Memory:  Immediate;   Good Recent;   Good Remote;   Good  Judgement:  Good  Insight:  Good  Psychomotor Activity:  Normal  Concentration:  Concentration: Good and Attention Span: Good  Recall:  Good  Fund of Knowledge:Good  Language: Good  Akathisia:  No  Handed:  Right  AIMS (if indicated):  not done  Assets:  Communication Skills Desire for Improvement Housing Social Support Talents/Skills Transportation Vocational/Educational  ADL's:  Intact  Cognition: WNL  Sleep:  Good   Screenings: AUDIT    Flowsheet Row [facility]  Alcohol Use Disorder Identification Test Final Score (AUDIT) 0      GAD-7    Flowsheet Row  Office Visit from 04/17/2023 in [facility]  Total GAD-7 Score 11      PHQ2-9    Flowsheet Row Office Visit from 04/17/2023 in [place]  PHQ-2 Total Score 1      Flowsheet Row Office Visit from 04/17/2023 in [facility] Admission (Discharged) from 04/03/2023 in [facility] ED from [facility]/02/20[facility]  C-SSRS RISK CATEGORY High Risk High Risk High Risk       Assessment and Plan: ***    Collaboration of Care: Medication Management AEB provider managing patient's psychiatric medications, Primary Care Provider AEB patient attempting to establish care with  a primary care provider, and Psychiatrist AEB patient being followed by mental health provider at this facility  Patient/Guardian was advised Release of Information must be obtained prior to any record release in order to collaborate their care with an outside provider. Patient/Guardian was advised if they have not already done so to contact the registration department to sign all necessary forms in order for [place] to release information regarding their care.   Consent: Patient/Guardian gives verbal consent for treatment and assignment of benefits for services provided during this visit. Patient/Guardian expressed understanding and agreed to proceed.   1. GAD (generalized anxiety disorder) (Primary)  - hydrOXYzine (ATARAX) 25 MG tablet; Take 1 tablet (25 mg total) by mouth 3 (three) times daily as needed for anxiety.  Dispense: 75 tablet; Refill: 1 - FLUoxetine (PROZAC) 20 MG capsule; Take 1 capsule (20 mg total) by mouth daily.  Dispense: 30 capsule; Refill: 1  2. MDD (major depressive disorder), recurrent severe, without psychosis (HCC)  - traZODone (DESYREL) 50 MG tablet; Take 1 tablet (50 mg total) by mouth at bedtime as needed for sleep.  Dispense: 30 tablet; Refill: 1 - FLUoxetine (PROZAC) 20 MG capsule; Take 1 capsule (20 mg total) by mouth daily.  Dispense: 30 capsule; Refill: 1  Patient to follow up in 6 weeks Provider spent a total of 35 minutes with the patient/reviewing patient's chart  Meta Hatchet, PA 12/17/20246:52 PM

## 2023-04-22 ENCOUNTER — Encounter (HOSPITAL_COMMUNITY): Payer: Self-pay

## 2023-04-22 ENCOUNTER — Emergency Department (HOSPITAL_COMMUNITY)
Admission: EM | Admit: 2023-04-22 | Discharge: 2023-04-22 | Disposition: A | Discharge status: Home / Self Care | Payer: Medicaid Other | Attending: Emergency Medicine | Admitting: Emergency Medicine

## 2023-04-22 ENCOUNTER — Other Ambulatory Visit: Payer: Self-pay

## 2023-04-22 DIAGNOSIS — Y909 Presence of alcohol in blood, level not specified: Secondary | ICD-10-CM | POA: Insufficient documentation

## 2023-04-22 DIAGNOSIS — F10120 Alcohol abuse with intoxication, uncomplicated: Secondary | ICD-10-CM | POA: Diagnosis present

## 2023-04-22 DIAGNOSIS — R112 Nausea with vomiting, unspecified: Secondary | ICD-10-CM | POA: Diagnosis not present

## 2023-04-22 DIAGNOSIS — F10920 Alcohol use, unspecified with intoxication, uncomplicated: Secondary | ICD-10-CM

## 2023-04-22 HISTORY — DX: Depression, unspecified: F32.A

## 2023-04-22 HISTORY — DX: Anxiety disorder, unspecified: F41.9

## 2023-04-22 MED ORDER — ONDANSETRON 4 MG PO TBDP
4.0000 mg | ORAL_TABLET | Freq: Once | ORAL | Status: AC
  Administered 2023-04-22: 4 mg via ORAL
  Filled 2023-04-22: qty 1

## 2023-04-22 NOTE — ED Notes (Signed)
Pt ambulated down the hall of hospital without any difficulty. Pt was steady on his feet and had no complaints.

## 2023-04-22 NOTE — ED Triage Notes (Signed)
Pt BIB EMS with reports of etoh. Pt is pleasant, girlfriend is just concerned because pt could not walk and was on the ground. Pt placed himself on the ground.

## 2023-04-22 NOTE — Discharge Instructions (Signed)
You have been seen today for your complaint of alcohol intoxication. Follow up with: Your primary care provider Please seek immediate medical care if you develop any of the following symptoms: You have any of the following: Trouble staying awake. Moderate to severe trouble with coordination, speech, memory, or attention. You are told you may have had a seizure. Vomiting bright red blood or material that looks like coffee grounds. Bloody stool (feces). The blood may make your stool bright red, black, or tarry. At this time there does not appear to be the presence of an emergent medical condition, however there is always the potential for conditions to change. Please read and follow the below instructions.  Do not take your medicine if  develop an itchy rash, swelling in your mouth or lips, or difficulty breathing; call 911 and seek immediate emergency medical attention if this occurs.  You may review your lab tests and imaging results in their entirety on your MyChart account.  Please discuss all results of fully with your primary care provider and other specialist at your follow-up visit.  Note: Portions of this text may have been transcribed using voice recognition software. Every effort was made to ensure accuracy; however, inadvertent computerized transcription errors may still be present.

## 2023-04-22 NOTE — ED Provider Notes (Signed)
  Physical Exam  BP 124/80   Pulse 81   Temp 98.3 F (36.8 C) (Oral)   Resp 18   SpO2 98%   Physical Exam Vitals and nursing note reviewed.  Constitutional:      General: He is not in acute distress.    Appearance: Normal appearance. He is normal weight. He is not ill-appearing.  HENT:     Head: Normocephalic and atraumatic.  Pulmonary:     Effort: Pulmonary effort is normal. No respiratory distress.  Abdominal:     General: Abdomen is flat.  Musculoskeletal:        General: Normal range of motion.     Cervical back: Neck supple.  Skin:    General: Skin is warm and dry.  Neurological:     Mental Status: He is alert and oriented to person, place, and time.  Psychiatric:        Mood and Affect: Mood normal.        Behavior: Behavior normal.     Procedures  Procedures  ED Course / MDM   Clinical Course as of 04/22/23 1049  Sun Apr 22, 2023  4010 MTF [AS]    Clinical Course User Index [AS] Michelle Piper, PA-C   Medical Decision Making Risk Prescription drug management.  Assumed care at shift change. See previous note for full HPI. In short, 22 year old male presenting for acute alcohol intoxication. Plan to metabolize to freedom, cannot currently walk independently.  1040-ambulatory without difficulty.  Alert, fully oriented.  Stable for discharge.  He was counseled on refraining from alcohol use until he is of legal age.  He voices understanding.  At this time there does not appear to be any evidence of an acute emergency medical condition and the patient appears stable for discharge with appropriate outpatient follow up. Diagnosis was discussed with patient who verbalizes understanding of care plan and is agreeable to discharge. I have discussed return precautions with patient who verbalizes understanding. Patient encouraged to follow-up with their PCP within 1 week. All questions answered.  Note: Portions of this report may have been transcribed using voice  recognition software. Every effort was made to ensure accuracy; however, inadvertent computerized transcription errors may still be present.   Michelle Piper, PA-C 04/22/23 1049    Pricilla Loveless, MD 04/23/23 [phone]

## 2023-04-22 NOTE — ED Provider Notes (Signed)
[name] EMERGENCY DEPARTMENT AT [facility] Provider Note   CSN: [id number] Arrival date & time: 04/22/23  4098     History  Chief Complaint  Patient presents with   Alcohol Intoxication    Blake Ellis is a 22 y.o. male presents via EMS for EtOH.  Pleasant, unable to ambulate independently.  States using it with his friends, and took several large gulps from a bottle of vodka.  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  Patient pleasant, cooperative, nauseous with a few episodes of emesis and route, no acute distress.  HPI     Home Medications Prior to Admission medications   Medication Sig Start Date End Date Taking? Authorizing Provider  FLUoxetine (PROZAC) 20 MG capsule Take 1 capsule (20 mg total) by mouth daily. 04/17/23 04/16/24  Nwoko, Tommas Olp, PA  hydrOXYzine (ATARAX) 25 MG tablet Take 1 tablet (25 mg total) by mouth 3 (three) times daily as needed for anxiety. 04/17/23   Nwoko, Tommas Olp, PA  nicotine polacrilex (NICORETTE) 2 MG gum Take 1 each (2 mg total) by mouth as needed for smoking cessation. 04/07/23   Massengill, Harrold Donath, MD  traZODone (DESYREL) 50 MG tablet Take 1 tablet (50 mg total) by mouth at bedtime as needed for sleep. 04/17/23   Nwoko, Tommas Olp, PA  Vitamin D, Ergocalciferol, (DRISDOL) 1.25 MG (50000 UNIT) CAPS capsule Take 1 capsule (50,000 Units total) by mouth every 7 (seven) days for 5 doses. 04/13/23 05/12/23  Phineas Inches, MD      Allergies    Shellfish allergy    Review of Systems   Review of Systems  Unable to perform ROS: Other (ETOH)  Gastrointestinal:  Positive for nausea and vomiting.    Physical Exam Updated Vital Signs BP 134/77   Pulse 78   Temp 97.7 F (36.5 C) (Oral)   Resp 14   SpO2 100%  Physical Exam Vitals and nursing note reviewed.  Constitutional:      Appearance: He is not ill-appearing or toxic-appearing.  HENT:     Head: Normocephalic and atraumatic.     Mouth/Throat:     Mouth: Mucous membranes  are moist.     Pharynx: No oropharyngeal exudate or posterior oropharyngeal erythema.  Eyes:     General:        Right eye: No discharge.        Left eye: No discharge.     Conjunctiva/sclera: Conjunctivae normal.  Cardiovascular:     Rate and Rhythm: Normal rate and regular rhythm.     Pulses: Normal pulses.     Heart sounds: Normal heart sounds. No murmur heard. Pulmonary:     Effort: Pulmonary effort is normal. No respiratory distress.     Breath sounds: Normal breath sounds. No wheezing or rales.  Abdominal:     General: Bowel sounds are normal. There is no distension.     Palpations: Abdomen is soft.     Tenderness: There is no abdominal tenderness. There is no guarding or rebound.  Musculoskeletal:        General: No deformity.     Cervical back: Neck supple.  Skin:    General: Skin is warm and dry.     Capillary Refill: Capillary refill takes less than 2 seconds.  Neurological:     General: No focal deficit present.     Mental Status: He is alert and oriented to person, place, and time. Mental status is at baseline.  Psychiatric:  Mood and Affect: Mood normal.     ED Results / Procedures / Treatments   Labs (all labs ordered are listed, but only abnormal results are displayed) Labs Reviewed - No data to display  EKG None  Radiology No results found.  Procedures Procedures    Medications Ordered in ED Medications  ondansetron (ZOFRAN-ODT) disintegrating tablet 4 mg (4 mg Oral Given 04/22/23 0617)    ED Course/ Medical Decision Making/ A&P Clinical Course as of 04/22/23 4010  [facility] Apr 22, 2023  2725 MTF [AS]    Clinical Course User Index [AS] Lula Olszewski Edsel Petrin, PA-C                                 Medical Decision Making 22 year old male with EtOH.  Vitals normal on intake.  Cardiopulmonary dam unremarkable, abdominal exam is benign.  Intoxicated.   Risk Prescription drug management.   ETOH. Will MTF.  Clinical concern for emergent  condition and management is exceedingly low.  Patient not yet clinically sober, unable to walk safely on his own.  Will continue to metabolize in the emergency department. Care of this patient signed out to oncoming ED provider Schutt, PA-C at time of shift change. All pertinent HPI, physical exam, and laboratory findings were discussed with them prior to my departure. Disposition of patient pending completion of workup, reevaluation, and clinical judgement of oncoming ED provider.   This chart was dictated using voice recognition software, Dragon. Despite the best efforts of this provider to proofread and correct errors, errors may still occur which can change documentation meaning.        Final Clinical Impression(s) / ED Diagnoses Final diagnoses:  None    Rx / DC Orders ED Discharge Orders     None         Sherrilee Gilles 04/22/23 3664    Nira Conn, MD 04/22/23 [phone]

## 2023-05-10 ENCOUNTER — Telehealth (HOSPITAL_COMMUNITY): Payer: Self-pay | Admitting: *Deleted

## 2023-05-10 NOTE — Telephone Encounter (Signed)
 Patient called states that he believes his Prozac is causing him to have worsening depression and wants his provider to call him to discuss a medication change or increase.

## 2023-05-19 ENCOUNTER — Other Ambulatory Visit (HOSPITAL_COMMUNITY): Payer: Self-pay | Admitting: Physician Assistant

## 2023-05-19 DIAGNOSIS — F332 Major depressive disorder, recurrent severe without psychotic features: Secondary | ICD-10-CM

## 2023-05-19 DIAGNOSIS — F411 Generalized anxiety disorder: Secondary | ICD-10-CM

## 2023-05-19 MED ORDER — FLUOXETINE HCL 40 MG PO CAPS: 40.0000 mg | ORAL_CAPSULE | Freq: Every day | ORAL | 1 refills | Status: DC

## 2023-05-19 NOTE — Progress Notes (Signed)
Provider was contacted by Elder Love, RN regarding patient's concern over their medication.  Provider reach out the patient and discussed their concerns.  Patient informed provider that the medication appeared to be working the first few weeks of taking it, but now it appears to have stopped working.  Patient has been taking his medication regularly despite worsening depression.  Provider recommended increasing patient's fluoxetine from 20 mg to 40 mg daily for the management of his depression.  Patient was agreeable to recommendation.  Patient's medication to be e-prescribed to pharmacy of choice.

## 2023-05-19 NOTE — Telephone Encounter (Signed)
Message acknowledged and reviewed. Provider reached out to patient to discuss concerns.

## 2023-05-23 ENCOUNTER — Encounter (HOSPITAL_COMMUNITY): Payer: Self-pay | Admitting: Physician Assistant

## 2023-05-23 ENCOUNTER — Telehealth (HOSPITAL_COMMUNITY): Payer: Medicaid Other | Admitting: Physician Assistant

## 2023-05-23 DIAGNOSIS — F411 Generalized anxiety disorder: Secondary | ICD-10-CM | POA: Diagnosis not present

## 2023-05-23 DIAGNOSIS — F332 Major depressive disorder, recurrent severe without psychotic features: Secondary | ICD-10-CM

## 2023-05-23 MED ORDER — TRAZODONE HCL 50 MG PO TABS: 50.0000 mg | ORAL_TABLET | Freq: Every evening | ORAL | 1 refills | Status: DC | PRN

## 2023-05-23 MED ORDER — HYDROXYZINE HCL 25 MG PO TABS: 25.0000 mg | ORAL_TABLET | Freq: Three times a day (TID) | ORAL | 1 refills | Status: DC | PRN

## 2023-05-23 NOTE — Progress Notes (Signed)
BH MD/PA/NP OP Progress Note  Virtual Visit via Video Note  I connected with Blake Ellis on 05/23/23 at  2:30 PM EST by a video enabled telemedicine application and verified that I am speaking with the correct person using two identifiers.  Location: Patient: Home Provider: Clinic   I discussed the limitations of evaluation and management by telemedicine and the availability of in person appointments. The patient expressed understanding and agreed to proceed.  Follow Up Instructions:   I discussed the assessment and treatment plan with the patient. The patient was provided an opportunity to ask questions and all were answered. The patient agreed with the plan and demonstrated an understanding of the instructions.   The patient was advised to call back or seek an in-person evaluation if the symptoms worsen or if the condition fails to improve as anticipated.  I provided 11 minutes of non-face-to-face time during this encounter.  Blake Hatchet, PA    05/23/2023 6:51 PM Blake Ellis  MRN:  [id number]  Chief Complaint:  Chief Complaint  Patient presents with   Follow-up   Medication Refill   HPI:   Blake Ellis is a 23 year old male with a past psychiatric history significant for major depressive disorder (recurrent severe, without psychosis) and generalized anxiety disorder who presents to [facility] for follow-up and medication management.  Patient is currently being managed on the following psychiatric medications:  Prozac 40 mg daily Hydroxyzine 25 mg 3 times daily as needed Trazodone 50 mg at bedtime as needed  Patient's Prozac was recently adjusted from 20 mg to 40 mg daily.  Since his most recent adjustment, patient reports that he has been taking his medications regularly.  Patient continues to endorse depression and rates his depression as 7 out of 10 with 10 being most severe.  Patient endorses depressive episodes 5 to 6 days  out of the week.  Patient endorses the following depressive symptoms: lack of motivation, irritability, and anhedonia.  Alleviating factors to his depression include isolating himself and playing video games.  Patient continues to endorse anxiety and rates his anxiety as 6 or 7 out of 10.  Patient's main stressor revolves around school.  Patient denies needing therapy at this time stating that he meets up with the school counselor once per week.  A PHQ-9 screen was performed with the patient scoring a 15.  A GAD-7 screen was also performed with the patient scoring a 10.  Patient is alert and oriented x 4, calm, cooperative, and fully engaged in conversation during the encounter.  Patient endorses fine mood stating that he is neither happy or sad.  Patient exhibits depressed mood with appropriate affect.  Patient denies suicidal or homicidal ideations.  He further denies auditory or visual hallucinations and does not appear to be responding to internal/external stimuli.  Patient endorses fair sleep and receives on average 5 hours of sleep per night.  Patient endorses fair appetite and eats on average 2 meals per day.  Patient endorses alcohol consumption sparingly.  Patient denies tobacco use or illicit drug use.  Visit Diagnosis:    ICD-10-CM   1. GAD (generalized anxiety disorder)  F41.1 hydrOXYzine (ATARAX) 25 MG tablet    2. 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.2 traZODone (DESYREL) 50 MG tablet      Past Psychiatric History:  Patient was recently diagnosed with major depressive disorder and generalized anxiety disorder after being admitted to [facility].  Patient endorses a past history of hospitalization due to mental health            - Patient was recently admitted to [facility] on 04/23/2023   Patient endorses a past history of suicide attempt.  Prior to his recent hospitalization at [facility], patient exhibited suicidal  ideations with a plan to overdose on Wellbutrin and Intuniv   Patient denies a past history of homicide attempt  Past Medical History:  Past Medical History:  Diagnosis Date   Anxiety    Depression    History reviewed. No pertinent surgical history.  Family Psychiatric History:  Father - bipolar disorder Brothers - autism Grandmother (maternal) - depression and anxiety   Family history of suicide attempt: Patient denies Family history of homicide attempt: Patient denies Family history of substance abuse: Patient reports that his father abused marijuana  Family History: History reviewed. No pertinent family history.  Social History:  Social History   Socioeconomic History   Marital status: Significant Other    Spouse name: Not on file   Number of children: Not on file   Years of education: Not on file   Highest education level: Not on file  Occupational History   Not on file  Tobacco Use   Smoking status: Never   Smokeless tobacco: Never  Substance and Sexual Activity   Alcohol use: Yes   Drug use: Yes    Types: Marijuana    Comment: daily use for anxiety and sleep   Sexual activity: Not on file  Other Topics Concern   Not on file  Social History Narrative   Pt states "I live with my fiancee here in [place]. We are both students at [employer]. We've been engaged for 1.5 year now".   Social Drivers of Corporate investment banker Strain: Not on file  Food Insecurity: Food Insecurity Present (04/03/2023)   Hunger Vital Sign    Worried About Running Out of Food in the Last Year: Sometimes true    Ran Out of Food in the Last Year: Sometimes true  Transportation Needs: No Transportation Needs (04/03/2023)   PRAPARE - Administrator, Civil Service (Medical): No    Lack of Transportation (Non-Medical): No  Physical Activity: Not on file  Stress: Not on file  Social Connections: Not on file    Allergies:  Allergies  Allergen Reactions   Shellfish Allergy  Anaphylaxis    Metabolic Disorder Labs: Lab Results  Component Value Date   HGBA1C 5.7 (H) 04/07/2023   MPG 116.89 04/07/2023   No results found for: "PROLACTIN" No results found for: "CHOL", "TRIG", "HDL", "CHOLHDL", "VLDL", "LDLCALC" Lab Results  Component Value Date   TSH 1.886 04/02/2023    Therapeutic Level Labs: No results found for: "LITHIUM" No results found for: "VALPROATE" No results found for: "CBMZ"  Current Medications: Current Outpatient Medications  Medication Sig Dispense Refill   FLUoxetine (PROZAC) 40 MG capsule Take 1 capsule (40 mg total) by mouth daily. 30 capsule 1   hydrOXYzine (ATARAX) 25 MG tablet Take 1 tablet (25 mg total) by mouth 3 (three) times daily as needed for anxiety. 75 tablet 1   nicotine polacrilex (NICORETTE) 2 MG gum Take 1 each (2 mg total) by mouth as needed for smoking cessation.     traZODone (DESYREL) 50 MG tablet Take 1 tablet (50 mg total) by mouth at bedtime as needed for sleep. 30 tablet 1   No current facility-administered medications for this visit.  Musculoskeletal: Strength & Muscle Tone: within normal limits Gait & Station: normal Patient leans: N/A  Psychiatric Specialty Exam: Review of Systems  Psychiatric/Behavioral:  Positive for dysphoric mood and sleep disturbance. Negative for decreased concentration, hallucinations, self-injury and suicidal ideas. The patient is nervous/anxious. The patient is not hyperactive.     There were no vitals taken for this visit.There is no height or weight on file to calculate BMI.  General Appearance: Casual  Eye Contact:  Good  Speech:  Clear and Coherent and Normal Rate  Volume:  Normal  Mood:  Anxious and Depressed  Affect:  Appropriate  Thought Process:  Coherent, Goal Directed, and Descriptions of Associations: Intact  Orientation:  Full (Time, Place, and Person)  Thought Content: WDL   Suicidal Thoughts:  No  Homicidal Thoughts:  No  Memory:  Immediate;    Good Recent;   Good Remote;   Good  Judgement:  Good  Insight:  Good  Psychomotor Activity:  Normal  Concentration:  Concentration: Good and Attention Span: Good  Recall:  Good  Fund of Knowledge: Good  Language: Good  Akathisia:  No  Handed:  Right  AIMS (if indicated): not done  Assets:  Communication Skills Desire for Improvement Housing Social Support Talents/Skills Transportation Vocational/Educational  ADL's:  Intact  Cognition: WNL  Sleep:  Fair   Screenings: AUDIT    Flowsheet Row [facility]  Alcohol Use Disorder Identification Test Final Score (AUDIT) 0      GAD-7    Flowsheet Row Video Visit from 05/23/2023 in [facility] Office Visit from 04/17/2023 in [facility]  Total GAD-7 Score 10 11      PHQ2-9    Flowsheet Row Video Visit from 05/23/2023 in [facility] Office Visit from 04/17/2023 in [facility]  PHQ-2 Total Score 3 1  PHQ-9 Total Score 15 --      Flowsheet Row Video Visit from 05/23/2023 in [facility] ED from 04/22/2023 in [facility] Emergency Department at [facility] Office Visit from 04/17/2023 in [facility]  C-SSRS RISK CATEGORY High Risk No Risk High Risk        Assessment and Plan:   Blake Ellis is a 23 year old male with a past psychiatric history significant for major depressive disorder (recurrent severe, without psychosis) and generalized anxiety disorder who presents to [facility] for follow-up and medication management.  Patient's Prozac was most recently adjusted from 20 mg to 40 mg daily for the management of his depressive symptoms.  Patient continues to endorse depressive symptoms as well as anxiety attributed to school.  Despite  experiencing depressive symptoms and anxiety, patient would like to continue taking his Prozac as prescribed.  Patient states that if his depression and anxiety worsen, then he will contact this facility for med management.  Patient would like to continue taking his hydroxyzine and trazodone at the same dosage as well.  Patient's medications to be e-prescribed to pharmacy of choice.  Collaboration of Care: Collaboration of Care: Medication Management AEB provider managing patient's psychiatric medications, Primary Care Provider AEB patient reports that he is attempting to establish care with a primary care provider, Psychiatrist AEB patient being followed by mental health provider, and Referral or follow-up with counselor/therapist AEB patient is being seen by a therapist outside of this facility one time per week.  Patient/Guardian was advised Release of Information must be obtained prior to any record release in order to collaborate their care with an outside provider. Patient/Guardian was advised if they have not already done so to contact the registration department to sign all necessary forms in order for [place] to release information regarding their care.   Consent: Patient/Guardian gives verbal consent for treatment and assignment of benefits for services provided during this visit. Patient/Guardian expressed understanding and agreed to proceed.   1. GAD (generalized anxiety disorder)  - hydrOXYzine (ATARAX) 25 MG tablet; Take 1 tablet (25 mg total) by mouth 3 (three) times daily as needed for anxiety.  Dispense: 75 tablet; Refill: 1  2. MDD (major depressive disorder), recurrent severe, without psychosis (HCC) Patient to continue taking fluoxetine 40 mg daily for the management of his major depressive disorder  - traZODone (DESYREL) 50 MG tablet; Take 1 tablet (50 mg total) by mouth at bedtime as needed for sleep.  Dispense: 30 tablet; Refill: 1  Patient to follow up in 6 weeks Provider spent a  total of 11 minutes with the patient/reviewing the patient's chart  Blake Hatchet, PA 05/23/2023, 6:51 PM

## 2023-06-18 ENCOUNTER — Telehealth (HOSPITAL_COMMUNITY): Payer: Self-pay

## 2023-06-18 NOTE — Telephone Encounter (Signed)
Pt stated that he is still depressed and would like a increase of the FLUoxetine (PROZAC) 40 MG capsule

## 2023-07-06 ENCOUNTER — Telehealth (INDEPENDENT_AMBULATORY_CARE_PROVIDER_SITE_OTHER): Payer: Medicaid Other | Admitting: Physician Assistant

## 2023-07-06 DIAGNOSIS — F332 Major depressive disorder, recurrent severe without psychotic features: Secondary | ICD-10-CM | POA: Diagnosis not present

## 2023-07-06 DIAGNOSIS — F411 Generalized anxiety disorder: Secondary | ICD-10-CM

## 2023-07-06 MED ORDER — FLUOXETINE HCL 20 MG PO CAPS: 60.0000 mg | ORAL_CAPSULE | Freq: Every day | ORAL | 1 refills | Status: DC

## 2023-07-06 MED ORDER — TRAZODONE HCL 50 MG PO TABS: 50.0000 mg | ORAL_TABLET | Freq: Every evening | ORAL | 1 refills | Status: DC | PRN

## 2023-07-06 MED ORDER — HYDROXYZINE HCL 25 MG PO TABS: 25.0000 mg | ORAL_TABLET | Freq: Three times a day (TID) | ORAL | 1 refills | Status: DC | PRN

## 2023-07-06 NOTE — Progress Notes (Signed)
 BH MD/PA/NP OP Progress Note  Virtual Visit via Video Note  I connected with Blake Ellis on 07/06/23 at  1:00 PM EST by a video enabled telemedicine application and verified that I am speaking with the correct person using two identifiers.  Location: Patient: Home Provider: Clinic   I discussed the limitations of evaluation and management by telemedicine and the availability of in person appointments. The patient expressed understanding and agreed to proceed.  Follow Up Instructions:   I discussed the assessment and treatment plan with the patient. The patient was provided an opportunity to ask questions and all were answered. The patient agreed with the plan and demonstrated an understanding of the instructions.   The patient was advised to call back or seek an in-person evaluation if the symptoms worsen or if the condition fails to improve as anticipated.  I provided 14 minutes of non-face-to-face time during this encounter.  Meta Hatchet, PA    07/06/2023 1:22 PM Laura Radilla  MRN:  [id number]  Chief Complaint:  Chief Complaint  Patient presents with   Follow-up   Medication Management   HPI:   Blake Ellis is a 23 year old male with a past psychiatric history significant for major depressive disorder (recurrent severe, without psychosis) and generalized anxiety disorder who presents to [facility] for follow-up and medication management.  Patient is currently being managed on the following psychiatric medications:  Prozac 40 mg daily Hydroxyzine 25 mg 3 times daily as needed Trazodone 50 mg at bedtime as needed  Patient reports that he continues to experience ongoing depression.  He states when he first started taking Prozac two 20 mg tablets, it was helpful in managing his depressive symptoms but the effectiveness did not last.  Patient relates his depression as 6 or 7 out of 10 with 10 being most severe.  Patient endorses  depressive episodes 5 to 6 days out of the week.  Patient endorses the following depressive symptoms: fatigue, emotional numbness, lack of motivation, and difficulty getting out of bed.  He endorses minimal anxiety but does endorse some stressors related to school.  A PHQ-9 screen was performed the patient scoring a 17.  A GAD-7 screen was also performed with the patient scoring a 9.  Patient is alert and oriented x 4, calm, cooperative, and fully engaged in conversation during the encounter.  Patient describes his mood as bland.  Patient exhibits depressed mood with appropriate affect.  Patient denies suicidal or homicidal ideations.  He further denies auditory or visual hallucinations and does not appear to be responding to internal/external stimuli.  Patient endorses fair sleep and receives on average 4 to 5 hours of sleep per night.  Patient endorses fair appetite and eats on average 2 meals per day.  Patient denied alcohol consumption, tobacco use, or illicit drug use.  Visit Diagnosis:    ICD-10-CM   1. GAD (generalized anxiety disorder)  F41.1 FLUoxetine (PROZAC) 20 MG capsule    hydrOXYzine (ATARAX) 25 MG tablet    2. 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.2 FLUoxetine (PROZAC) 20 MG capsule    traZODone (DESYREL) 50 MG tablet      Past Psychiatric History:  Patient was recently diagnosed with major depressive disorder and generalized anxiety disorder after being admitted to [facility].   Patient endorses a past history of hospitalization due to mental health            - Patient was  recently admitted to [facility] on 04/23/2023   Patient endorses a past history of suicide attempt.  Prior to his recent hospitalization at [facility], patient exhibited suicidal ideations with a plan to overdose on Wellbutrin and Intuniv   Patient denies a past history of homicide attempt  Past Medical History:  Past Medical History:  Diagnosis  Date   Anxiety    Depression    History reviewed. No pertinent surgical history.  Family Psychiatric History:  Father - bipolar disorder Brothers - autism Grandmother (maternal) - depression and anxiety   Family history of suicide attempt: Patient denies Family history of homicide attempt: Patient denies Family history of substance abuse: Patient reports that his father abused marijuana  Family History: History reviewed. No pertinent family history.  Social History:  Social History   Socioeconomic History   Marital status: Significant Other    Spouse name: Not on file   Number of children: Not on file   Years of education: Not on file   Highest education level: Not on file  Occupational History   Not on file  Tobacco Use   Smoking status: Never   Smokeless tobacco: Never  Substance and Sexual Activity   Alcohol use: Yes   Drug use: Yes    Types: Marijuana    Comment: daily use for anxiety and sleep   Sexual activity: Not on file  Other Topics Concern   Not on file  Social History Narrative   Pt states "I live with my fiancee here in [place]. 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 We've been engaged for 1.5 year now".   Social Drivers of Corporate investment banker Strain: Not on file  Food Insecurity: Food Insecurity Present (04/03/2023)   Hunger Vital Sign    Worried About Running Out of Food in the Last Year: Sometimes true    Ran Out of Food in the Last Year: Sometimes true  Transportation Needs: No Transportation Needs (04/03/2023)   PRAPARE - Administrator, Civil Service (Medical): No    Lack of Transportation (Non-Medical): No  Physical Activity: Not on file  Stress: Not on file  Social Connections: Not on file    Allergies:  Allergies  Allergen Reactions   Shellfish Allergy Anaphylaxis    Metabolic Disorder Labs: Lab Results  Component Value Date   HGBA1C 5.7 (H) 04/07/2023   MPG 116.89 04/07/2023   No results found for: "PROLACTIN" No  results found for: "CHOL", "TRIG", "HDL", "CHOLHDL", "VLDL", "LDLCALC" Lab Results  Component Value Date   TSH 1.886 04/02/2023    Therapeutic Level Labs: No results found for: "LITHIUM" No results found for: "VALPROATE" No results found for: "CBMZ"  Current Medications: Current Outpatient Medications  Medication Sig Dispense Refill   FLUoxetine (PROZAC) 20 MG capsule Take 3 capsules (60 mg total) by mouth daily. 90 capsule 1   hydrOXYzine (ATARAX) 25 MG tablet Take 1 tablet (25 mg total) by mouth 3 (three) times daily as needed for anxiety. 75 tablet 1   nicotine polacrilex (NICORETTE) 2 MG gum Take 1 each (2 mg total) by mouth as needed for smoking cessation.     traZODone (DESYREL) 50 MG tablet Take 1 tablet (50 mg total) by mouth at bedtime as needed for sleep. 30 tablet 1   No current facility-administered medications for this visit.     Musculoskeletal: Strength & Muscle Tone: within normal limits Gait & Station: normal Patient leans: N/A  Psychiatric Specialty Exam: Review of Systems  Psychiatric/Behavioral:  Positive for dysphoric mood and sleep disturbance. Negative for decreased concentration, hallucinations, self-injury and suicidal ideas. The patient is nervous/anxious. The patient is not hyperactive.     There were no vitals taken for this visit.There is no height or weight on file to calculate BMI.  General Appearance: Casual  Eye Contact:  Good  Speech:  Clear and Coherent and Normal Rate  Volume:  Normal  Mood:  Anxious and Depressed  Affect:  Appropriate  Thought Process:  Coherent, Goal Directed, and Descriptions of Associations: Intact  Orientation:  Full (Time, Place, and Person)  Thought Content: WDL   Suicidal Thoughts:  No  Homicidal Thoughts:  No  Memory:  Immediate;   Good Recent;   Good Remote;   Good  Judgement:  Good  Insight:  Good  Psychomotor Activity:  Normal  Concentration:  Concentration: Good and Attention Span: Good  Recall:  Good   Fund of Knowledge: Good  Language: Good  Akathisia:  No  Handed:  Right  AIMS (if indicated): not done  Assets:  Communication Skills Desire for Improvement Housing Social Support Talents/Skills Transportation Vocational/Educational  ADL's:  Intact  Cognition: WNL  Sleep:  Fair   Screenings: AUDIT    Flowsheet Row [facility]  Alcohol Use Disorder Identification Test Final Score (AUDIT) 0      GAD-7    Flowsheet Row Video Visit from 07/06/2023 in [facility] Video Visit from 05/23/2023 in [place] Office Visit from 04/17/2023 in [facility]  Total GAD-7 Score 9 10 11       PHQ2-9    Flowsheet Row Video Visit from 07/06/2023 in [facility] Video Visit from 05/23/2023 in [facility] Office Visit from 04/17/2023 in [facility]  PHQ-2 Total Score 4 3 1   PHQ-9 Total Score 17 15 --      Flowsheet Row Video Visit from 07/06/2023 in [facility] Video Visit from 05/23/2023 in [facility] ED from 04/22/2023 in [facility] Emergency Department at [facility]  C-SSRS RISK CATEGORY Moderate Risk High Risk No Risk        Assessment and Plan:   Treyton Slimp is a 23 year old male with a past psychiatric history significant for major depressive disorder (recurrent severe, without psychosis) and generalized anxiety disorder who presents to [facility] for follow-up and medication management.  Patient presents to the encounter endorsing ongoing depression.  He reports that his use of Prozac 40 mg daily has not been effective in managing his symptoms.  Provider recommended increasing his dosage of Prozac from 40 mg to 60 mg daily for the  management of his depressive symptoms.  Patient was agreeable to recommendation.  Patient's medications to be e-prescribed to pharmacy of choice.  Collaboration of Care: Collaboration of Care: Medication Management AEB provider managing patient's psychiatric medications, Primary Care Provider AEB patient reports that he is attempting to establish care with a primary care provider, Psychiatrist AEB patient being followed by mental health provider, and Referral or follow-up with counselor/therapist AEB patient is being seen by a therapist outside of this facility one time per week.  Patient/Guardian was advised Release of Information must be obtained prior to any record release in order to collaborate their care with an outside provider. Patient/Guardian was advised if they have not  already done so to contact the registration department to sign all necessary forms in order for [place] to release information regarding their care.   Consent: Patient/Guardian gives verbal consent for treatment and assignment of benefits for services provided during this visit. Patient/Guardian expressed understanding and agreed to proceed.   1. GAD (generalized anxiety disorder)  - FLUoxetine (PROZAC) 20 MG capsule; Take 3 capsules (60 mg total) by mouth daily.  Dispense: 90 capsule; Refill: 1 - hydrOXYzine (ATARAX) 25 MG tablet; Take 1 tablet (25 mg total) by mouth 3 (three) times daily as needed for anxiety.  Dispense: 75 tablet; Refill: 1  2. MDD (major depressive disorder), recurrent severe, without psychosis (HCC)  - FLUoxetine (PROZAC) 20 MG capsule; Take 3 capsules (60 mg total) by mouth daily.  Dispense: 90 capsule; Refill: 1 - traZODone (DESYREL) 50 MG tablet; Take 1 tablet (50 mg total) by mouth at bedtime as needed for sleep.  Dispense: 30 tablet; Refill: 1  Patient to follow up in 6 weeks Provider spent a total of 14 minutes with the patient/reviewing the patient's chart  Meta Hatchet, PA 07/06/2023, 1:22  PM

## 2023-07-09 ENCOUNTER — Encounter (HOSPITAL_COMMUNITY): Payer: Self-pay | Admitting: Physician Assistant

## 2023-07-18 ENCOUNTER — Other Ambulatory Visit (HOSPITAL_COMMUNITY): Payer: Self-pay | Admitting: Physician Assistant

## 2023-07-18 DIAGNOSIS — F332 Major depressive disorder, recurrent severe without psychotic features: Secondary | ICD-10-CM

## 2023-07-18 DIAGNOSIS — F411 Generalized anxiety disorder: Secondary | ICD-10-CM

## 2023-07-18 NOTE — Telephone Encounter (Signed)
 Message acknowledged and reviewed. Patient's fluoxetine was adjusted during his last appointment.

## 2023-08-17 ENCOUNTER — Telehealth (HOSPITAL_COMMUNITY): Admitting: Physician Assistant

## 2023-08-17 ENCOUNTER — Encounter (HOSPITAL_COMMUNITY): Payer: Self-pay | Admitting: Physician Assistant

## 2023-08-17 DIAGNOSIS — F332 Major depressive disorder, recurrent severe without psychotic features: Secondary | ICD-10-CM | POA: Diagnosis not present

## 2023-08-17 DIAGNOSIS — F411 Generalized anxiety disorder: Secondary | ICD-10-CM

## 2023-08-17 MED ORDER — TRAZODONE HCL 50 MG PO TABS: 50.0000 mg | ORAL_TABLET | Freq: Every evening | ORAL | 1 refills | Status: DC | PRN

## 2023-08-17 MED ORDER — HYDROXYZINE HCL 25 MG PO TABS: 25.0000 mg | ORAL_TABLET | Freq: Three times a day (TID) | ORAL | 1 refills | Status: AC | PRN

## 2023-08-17 MED ORDER — FLUOXETINE HCL 20 MG PO CAPS: 60.0000 mg | ORAL_CAPSULE | Freq: Every day | ORAL | 1 refills | Status: AC

## 2023-08-17 NOTE — Progress Notes (Signed)
 BH MD/PA/NP OP Progress Note  Virtual Visit via Video Note  I connected with Blake Ellis on 08/17/23 at  1:00 PM EDT by a video enabled telemedicine application and verified that I am speaking with the correct person using two identifiers.  Location: Patient: Home Provider: Clinic   I discussed the limitations of evaluation and management by telemedicine and the availability of in person appointments. The patient expressed understanding and agreed to proceed.  Follow Up Instructions:   I discussed the assessment and treatment plan with the patient. The patient was provided an opportunity to ask questions and all were answered. The patient agreed with the plan and demonstrated an understanding of the instructions.   The patient was advised to call back or seek an in-person evaluation if the symptoms worsen or if the condition fails to improve as anticipated.  I provided 15 minutes of non-face-to-face time during this encounter.  Gates Kasal, PA    08/17/2023 6:47 PM Blake Ellis  MRN:  [id number]  Chief Complaint:  Chief Complaint  Patient presents with   Follow-up   Medication Refill   HPI:   Blake Ellis is a 23 year old male with a past psychiatric history significant for major depressive disorder (recurrent severe, without psychosis) and generalized anxiety disorder who presents to [facility] via virtual video visit  for follow-up and medication management.  Patient is currently being managed on the following psychiatric medications:  Prozac  60 mg daily Hydroxyzine  25 mg 3 times daily as needed Trazodone  50 mg at bedtime as needed  Patient presents to the encounter stating that he has been taking his medications regularly.  He reports that he has been experiencing a lot less problems since his Prozac  was adjusted from 40 mg to 60 mg daily.  Patient continues to endorse depression and rates his depression at 3 out of 10 with 10  being most severe.  Patient endorses depressive episodes 1 time per week.  Patient endorses the following depressive symptoms: lack of motivation, and not wanting to go out as much.  Patient denies self isolation.  He states that his anxiety has been fine and states that he has been generally less anxious during his day-to-day activities.  He does endorse some stress attributed to school and reports that his fiance has some concerns over him gaining weight since being on Prozac .  A PHQ-9 screen was performed with the patient scoring a 9.  A GAD-7 screen was also performed the patient scoring a 7.  Patient is alert and oriented x 4, calm, cooperative, and fully engaged in conversation during the encounter.  Patient endorses happy mood.  Patient exhibits euthymic mood with appropriate affect.  Patient denies suicidal or homicidal ideations.  He further denies auditory or visual hallucinations and does not appear to be responding to internal/external stimuli.  Patient endorses good sleep and receives on average 6 to 7 hours of sleep per night.  Patient endorses good appetite and eats on average 3 meals per day.  Patient endorses alcohol consumption on occasion.  Patient denies tobacco use or illicit drug use.  Visit Diagnosis:    ICD-10-CM   1. GAD (generalized anxiety disorder)  F41.1 hydrOXYzine  (ATARAX ) 25 MG tablet    FLUoxetine  (PROZAC ) 20 MG capsule    2. 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.2 traZODone  (DESYREL ) 50 MG tablet    FLUoxetine  (PROZAC ) 20 MG capsule      Past Psychiatric History:  Patient was recently diagnosed with major depressive disorder and generalized anxiety disorder after being admitted to [facility].   Patient endorses a past history of hospitalization due to mental health            - Patient was recently admitted to [facility] on 04/23/2023   Patient endorses a past history of suicide attempt.  Prior to his  recent hospitalization at [facility], patient exhibited suicidal ideations with a plan to overdose on Wellbutrin and Intuniv   Patient denies a past history of homicide attempt  Past Medical History:  Past Medical History:  Diagnosis Date   Anxiety    Depression    History reviewed. No pertinent surgical history.  Family Psychiatric History:  Father - bipolar disorder Brothers - autism Grandmother (maternal) - depression and anxiety   Family history of suicide attempt: Patient denies Family history of homicide attempt: Patient denies Family history of substance abuse: Patient reports that his father abused marijuana  Family History: History reviewed. No pertinent family history.  Social History:  Social History   Socioeconomic History   Marital status: Significant Other    Spouse name: Not on file   Number of children: Not on file   Years of education: Not on file   Highest education level: Not on file  Occupational History   Not on file  Tobacco Use   Smoking status: Never   Smokeless tobacco: Never  Substance and Sexual Activity   Alcohol use: Yes   Drug use: Yes    Types: Marijuana    Comment: daily use for anxiety and sleep   Sexual activity: Not on file  Other Topics Concern   Not on file  Social History Narrative   Pt states "I live with my fiancee here in [place]. 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 We've been engaged for 1.5 year now".   Social Drivers of Corporate investment banker Strain: Not on file  Food Insecurity: Food Insecurity Present (04/03/2023)   Hunger Vital Sign    Worried About Running Out of Food in the Last Year: Sometimes true    Ran Out of Food in the Last Year: Sometimes true  Transportation Needs: No Transportation Needs (04/03/2023)   PRAPARE - Administrator, Civil Service (Medical): No    Lack of Transportation (Non-Medical): No  Physical Activity: Not on file  Stress: Not on file  Social Connections: Not on file     Allergies:  Allergies  Allergen Reactions   Shellfish Allergy Anaphylaxis    Metabolic Disorder Labs: Lab Results  Component Value Date   HGBA1C 5.7 (H) 04/07/2023   MPG 116.89 04/07/2023   No results found for: "PROLACTIN" No results found for: "CHOL", "TRIG", "HDL", "CHOLHDL", "VLDL", "LDLCALC" Lab Results  Component Value Date   TSH 1.886 04/02/2023    Therapeutic Level Labs: No results found for: "LITHIUM" No results found for: "VALPROATE" No results found for: "CBMZ"  Current Medications: Current Outpatient Medications  Medication Sig Dispense Refill   FLUoxetine  (PROZAC ) 20 MG capsule Take 3 capsules (60 mg total) by mouth daily. 90 capsule 1   hydrOXYzine  (ATARAX ) 25 MG tablet Take 1 tablet (25 mg total) by mouth 3 (three) times daily as needed for anxiety. 75 tablet 1   nicotine  polacrilex (NICORETTE ) 2 MG gum Take 1 each (2 mg total) by mouth as needed for smoking cessation.     traZODone  (DESYREL ) 50 MG tablet Take 1 tablet (50 mg  total) by mouth at bedtime as needed for sleep. 30 tablet 1   No current facility-administered medications for this visit.     Musculoskeletal: Strength & Muscle Tone: within normal limits Gait & Station: normal Patient leans: N/A  Psychiatric Specialty Exam: Review of Systems  Psychiatric/Behavioral:  Positive for dysphoric mood and sleep disturbance. Negative for decreased concentration, hallucinations, self-injury and suicidal ideas. The patient is nervous/anxious. The patient is not hyperactive.     There were no vitals taken for this visit.There is no height or weight on file to calculate BMI.  General Appearance: Casual  Eye Contact:  Good  Speech:  Clear and Coherent and Normal Rate  Volume:  Normal  Mood:  Anxious and Depressed  Affect:  Appropriate  Thought Process:  Coherent, Goal Directed, and Descriptions of Associations: Intact  Orientation:  Full (Time, Place, and Person)  Thought Content: WDL   Suicidal  Thoughts:  No  Homicidal Thoughts:  No  Memory:  Immediate;   Good Recent;   Good Remote;   Good  Judgement:  Good  Insight:  Good  Psychomotor Activity:  Normal  Concentration:  Concentration: Good and Attention Span: Good  Recall:  Good  Fund of Knowledge: Good  Language: Good  Akathisia:  No  Handed:  Right  AIMS (if indicated): not done  Assets:  Communication Skills Desire for Improvement Housing Social Support Talents/Skills Transportation Vocational/Educational  ADL's:  Intact  Cognition: WNL  Sleep:  Fair   Screenings: AUDIT    Flowsheet Row [facility]  Alcohol Use Disorder Identification Test Final Score (AUDIT) 0      GAD-7    Flowsheet Row Video Visit from 08/17/2023 in [facility] Video Visit from 07/06/2023 in [facility] Video Visit from 05/23/2023 in [facility] Office Visit from 04/17/2023 in [facility]  Total GAD-7 Score 7 9 10 11       PHQ2-9    Flowsheet Row Video Visit from 08/17/2023 in [facility] Video Visit from 07/06/2023 in [facility] Video Visit from 05/23/2023 in [facility] Office Visit from 04/17/2023 in [facility]  PHQ-2 Total Score 2 4 3 1   PHQ-9 Total Score 9 17 15  --      Flowsheet Row Video Visit from 08/17/2023 in [facility] Video Visit from 07/06/2023 in [facility] Video Visit from 05/23/2023 in [facility]  C-SSRS RISK CATEGORY Moderate Risk Moderate Risk High Risk        Assessment and Plan:   Blake Ellis is a 24 year old male with a past psychiatric history significant for major depressive disorder (recurrent severe, without psychosis) and  generalized anxiety disorder who presents to [facility] via virtual video visit for follow-up and medication management.  Patient presents to the encounter stating that he continues to take his medications regularly and denies experiencing any adverse side effects.  He reports that his fiance does have concerns over his weight since starting Prozac .  He reports that she believes that he has gained more weight while on Prozac .  Patient to present to his next appointment in person to obtain his weight.  Patient reports that his depression has been much better since the adjustment of his Prozac  from 40 mg to 60 mg daily.  He continues to experience some depression and a PHQ-9 screen was performed with the patient scoring a 9.  A GAD-7 screen was also performed with the patient scoring a 7.  Despite his PHQ-9 and GAD-7 screen scores, patient wishes to continue taking his medications as prescribed and endorses stability.  Patient's medications to be e-prescribed to pharmacy of choice.  A [place] Suicide Severity Rating Scale was performed with the patient being considered moderate risk.  Patient denied suicidal ideations following the conclusion of the encounter and was able to contract for safety.  Collaboration of Care: Collaboration of Care: Medication Management AEB provider managing patient's psychiatric medications, Primary Care Provider AEB patient reports that he is attempting to establish care with a primary care provider, Psychiatrist AEB patient being followed by mental health provider, and Referral or follow-up with counselor/therapist AEB patient is being seen by a therapist outside of this facility one time per week.  Patient/Guardian was advised Release of Information must be obtained prior to any record release in order to collaborate their care with an outside provider. Patient/Guardian was advised if they have not already done so to contact the  registration department to sign all necessary forms in order for us  to release information regarding their care.   Consent: Patient/Guardian gives verbal consent for treatment and assignment of benefits for services provided during this visit. Patient/Guardian expressed understanding and agreed to proceed.   1. GAD (generalized anxiety disorder)  - FLUoxetine  (PROZAC ) 20 MG capsule; Take 3 capsules (60 mg total) by mouth daily.  Dispense: 90 capsule; Refill: 1 - hydrOXYzine  (ATARAX ) 25 MG tablet; Take 1 tablet (25 mg total) by mouth 3 (three) times daily as needed for anxiety.  Dispense: 75 tablet; Refill: 1  2. MDD (major depressive disorder), recurrent severe, without psychosis (HCC)  - FLUoxetine  (PROZAC ) 20 MG capsule; Take 3 capsules (60 mg total) by mouth daily.  Dispense: 90 capsule; Refill: 1 - traZODone  (DESYREL ) 50 MG tablet; Take 1 tablet (50 mg total) by mouth at bedtime as needed for sleep.  Dispense: 30 tablet; Refill: 1  Patient to follow up in 6 weeks Provider spent a total of 15 minutes with the patient/reviewing the patient's chart  Gates Kasal, PA 08/17/2023, 6:47 PM

## 2023-09-28 ENCOUNTER — Telehealth (HOSPITAL_COMMUNITY): Admitting: Physician Assistant

## 2023-09-28 ENCOUNTER — Encounter (HOSPITAL_COMMUNITY): Payer: Self-pay

## 2023-09-30 DEATH — deceased

## 2023-10-13 ENCOUNTER — Other Ambulatory Visit (HOSPITAL_COMMUNITY): Payer: Self-pay | Admitting: Physician Assistant

## 2023-10-13 DIAGNOSIS — F332 Major depressive disorder, recurrent severe without psychotic features: Secondary | ICD-10-CM
